# Patient Record
Sex: Female | Born: 1995 | Race: Black or African American | Hispanic: No | Marital: Single | Smoking: Former smoker
Health system: Southern US, Community
[De-identification: ages and names within clinical notes are randomized; demographics above are authoritative.]

## PROBLEM LIST (undated history)

## (undated) ENCOUNTER — Inpatient Hospital Stay (HOSPITAL_COMMUNITY): Payer: Self-pay

## (undated) ENCOUNTER — Emergency Department (HOSPITAL_COMMUNITY)

## (undated) DIAGNOSIS — F319 Bipolar disorder, unspecified: Secondary | ICD-10-CM

## (undated) DIAGNOSIS — N390 Urinary tract infection, site not specified: Secondary | ICD-10-CM

## (undated) DIAGNOSIS — F1410 Cocaine abuse, uncomplicated: Secondary | ICD-10-CM

## (undated) DIAGNOSIS — F32A Depression, unspecified: Secondary | ICD-10-CM

## (undated) HISTORY — PX: WISDOM TOOTH EXTRACTION: SHX21

---
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
## 2016-03-26 ENCOUNTER — Ambulatory Visit
Admission: RE | Admit: 2016-03-26 | Discharge: 2016-03-26 | Disposition: A | Discharge status: Home / Self Care | Payer: Medicaid Other | Source: Ambulatory Visit | Attending: Family Medicine | Admitting: Family Medicine

## 2016-03-26 ENCOUNTER — Other Ambulatory Visit: Payer: Self-pay | Admitting: Student

## 2016-03-26 DIAGNOSIS — T1490XA Injury, unspecified, initial encounter: Secondary | ICD-10-CM

## 2016-03-26 DIAGNOSIS — R52 Pain, unspecified: Secondary | ICD-10-CM

## 2016-12-06 ENCOUNTER — Emergency Department
Admission: EM | Admit: 2016-12-06 | Discharge: 2016-12-07 | Disposition: A | Discharge status: Home / Self Care | Payer: Medicaid Other | Attending: Emergency Medicine | Admitting: Emergency Medicine

## 2016-12-06 DIAGNOSIS — Y999 Unspecified external cause status: Secondary | ICD-10-CM | POA: Diagnosis not present

## 2016-12-06 DIAGNOSIS — S50812A Abrasion of left forearm, initial encounter: Secondary | ICD-10-CM | POA: Insufficient documentation

## 2016-12-06 DIAGNOSIS — Y92410 Unspecified street and highway as the place of occurrence of the external cause: Secondary | ICD-10-CM | POA: Diagnosis not present

## 2016-12-06 DIAGNOSIS — Y9389 Activity, other specified: Secondary | ICD-10-CM | POA: Diagnosis not present

## 2016-12-06 DIAGNOSIS — S0083XA Contusion of other part of head, initial encounter: Secondary | ICD-10-CM | POA: Diagnosis not present

## 2016-12-06 DIAGNOSIS — Z79899 Other long term (current) drug therapy: Secondary | ICD-10-CM | POA: Insufficient documentation

## 2016-12-06 DIAGNOSIS — R51 Headache: Secondary | ICD-10-CM | POA: Diagnosis present

## 2016-12-06 DIAGNOSIS — T148XXA Other injury of unspecified body region, initial encounter: Secondary | ICD-10-CM

## 2016-12-06 HISTORY — DX: Bipolar disorder, unspecified: F31.9

## 2016-12-07 ENCOUNTER — Emergency Department: Payer: Medicaid Other

## 2016-12-07 ENCOUNTER — Encounter: Payer: Self-pay | Admitting: Emergency Medicine

## 2016-12-07 LAB — CBC WITH DIFFERENTIAL/PLATELET
BASOS ABS: 0 10*3/uL (ref 0–0.1)
BASOS PCT: 1 %
EOS ABS: 0.1 10*3/uL (ref 0–0.7)
EOS PCT: 3 %
HCT: 39.5 % (ref 35.0–47.0)
Hemoglobin: 13.6 g/dL (ref 12.0–16.0)
LYMPHS PCT: 47 %
Lymphs Abs: 2.2 10*3/uL (ref 1.0–3.6)
MCH: 31.4 pg (ref 26.0–34.0)
MCHC: 34.4 g/dL (ref 32.0–36.0)
MCV: 91.3 fL (ref 80.0–100.0)
Monocytes Absolute: 0.6 10*3/uL (ref 0.2–0.9)
Monocytes Relative: 12 %
Neutro Abs: 1.7 10*3/uL (ref 1.4–6.5)
Neutrophils Relative %: 37 %
Platelets: 306 10*3/uL (ref 150–440)
RBC: 4.33 MIL/uL (ref 3.80–5.20)
RDW: 12.6 % (ref 11.5–14.5)
WBC: 4.6 10*3/uL (ref 3.6–11.0)

## 2016-12-07 LAB — BASIC METABOLIC PANEL
ANION GAP: 13 (ref 5–15)
BUN: 6 mg/dL (ref 6–20)
CO2: 19 mmol/L — ABNORMAL LOW (ref 22–32)
Calcium: 9 mg/dL (ref 8.9–10.3)
Chloride: 110 mmol/L (ref 101–111)
Creatinine, Ser: 0.88 mg/dL (ref 0.44–1.00)
Glucose, Bld: 88 mg/dL (ref 65–99)
Potassium: 3.1 mmol/L — ABNORMAL LOW (ref 3.5–5.1)
SODIUM: 142 mmol/L (ref 135–145)

## 2016-12-07 LAB — POCT PREGNANCY, URINE: Preg Test, Ur: NEGATIVE

## 2016-12-07 MED ORDER — SODIUM CHLORIDE 0.9 % IV BOLUS (SEPSIS)
1000.0000 mL | Freq: Once | INTRAVENOUS | Status: AC
  Administered 2016-12-07: 1000 mL via INTRAVENOUS

## 2016-12-07 NOTE — ED Notes (Signed)
Pt back from CT
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
## 2016-12-07 NOTE — Discharge Instructions (Signed)
Please seek medical attention for any high fevers, chest pain, shortness of breath, change in behavior, persistent vomiting, bloody stool or any other new or concerning symptoms.  

## 2016-12-07 NOTE — ED Notes (Signed)
Pt transported to CT ?
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
## 2016-12-13 ENCOUNTER — Emergency Department
Admission: EM | Admit: 2016-12-13 | Discharge: 2016-12-14 | Disposition: A | Discharge status: Home / Self Care | Payer: Medicaid Other | Attending: Emergency Medicine | Admitting: Emergency Medicine

## 2016-12-13 ENCOUNTER — Encounter: Payer: Self-pay | Admitting: Emergency Medicine

## 2016-12-13 DIAGNOSIS — Z008 Encounter for other general examination: Secondary | ICD-10-CM | POA: Diagnosis present

## 2016-12-13 DIAGNOSIS — F10129 Alcohol abuse with intoxication, unspecified: Secondary | ICD-10-CM | POA: Insufficient documentation

## 2016-12-13 DIAGNOSIS — F10929 Alcohol use, unspecified with intoxication, unspecified: Secondary | ICD-10-CM

## 2016-12-13 DIAGNOSIS — F1210 Cannabis abuse, uncomplicated: Secondary | ICD-10-CM | POA: Diagnosis not present

## 2016-12-13 LAB — POCT PREGNANCY, URINE: Preg Test, Ur: NEGATIVE

## 2016-12-13 LAB — COMPREHENSIVE METABOLIC PANEL
ALK PHOS: 44 U/L (ref 38–126)
ALT: 14 U/L (ref 14–54)
ANION GAP: 10 (ref 5–15)
AST: 23 U/L (ref 15–41)
Albumin: 4.4 g/dL (ref 3.5–5.0)
BILIRUBIN TOTAL: 0.5 mg/dL (ref 0.3–1.2)
BUN: <5 mg/dL — ABNORMAL LOW (ref 6–20)
CALCIUM: 8.8 mg/dL — AB (ref 8.9–10.3)
CO2: 22 mmol/L (ref 22–32)
Chloride: 114 mmol/L — ABNORMAL HIGH (ref 101–111)
Creatinine, Ser: 0.7 mg/dL (ref 0.44–1.00)
GFR calc non Af Amer: >60 mL/min (ref >60)
Glucose, Bld: 85 mg/dL (ref 65–99)
Potassium: 3.1 mmol/L — ABNORMAL LOW (ref 3.5–5.1)
Sodium: 146 mmol/L — ABNORMAL HIGH (ref 135–145)
TOTAL PROTEIN: 7.5 g/dL (ref 6.5–8.1)

## 2016-12-13 LAB — CBC
HCT: 40.4 % (ref 35.0–47.0)
Hemoglobin: 14 g/dL (ref 12.0–16.0)
MCH: 31 pg (ref 26.0–34.0)
MCHC: 34.5 g/dL (ref 32.0–36.0)
MCV: 89.9 fL (ref 80.0–100.0)
PLATELETS: 293 10*3/uL (ref 150–440)
RBC: 4.5 MIL/uL (ref 3.80–5.20)
RDW: 12.7 % (ref 11.5–14.5)
WBC: 5.1 10*3/uL (ref 3.6–11.0)

## 2016-12-13 LAB — URINE DRUG SCREEN, QUALITATIVE (ARMC ONLY)
Amphetamines, Ur Screen: NOT DETECTED
BARBITURATES, UR SCREEN: NOT DETECTED
BENZODIAZEPINE, UR SCRN: NOT DETECTED
CANNABINOID 50 NG, UR ~~LOC~~: POSITIVE — AB
COCAINE METABOLITE, UR ~~LOC~~: NOT DETECTED
MDMA (Ecstasy)Ur Screen: NOT DETECTED
Methadone Scn, Ur: NOT DETECTED
OPIATE, UR SCREEN: NOT DETECTED
PHENCYCLIDINE (PCP) UR S: NOT DETECTED
TRICYCLIC, UR SCREEN: NOT DETECTED

## 2016-12-13 LAB — ACETAMINOPHEN LEVEL: Acetaminophen (Tylenol), Serum: <10 ug/mL — ABNORMAL LOW (ref 10–30)

## 2016-12-13 LAB — ETHANOL: Alcohol, Ethyl (B): 207 mg/dL — ABNORMAL HIGH (ref <5)
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
## 2016-12-13 NOTE — ED Notes (Signed)
Pt agitated and crying loudly, Dr York Cerise to pt, Lafonda Mosses, NT able to calm pt, pt upset because boyfriend not answering phone and got off work at "five o'clock", pt oriented to POC: pt is to calm and sober and is willing to relax until labs result

## 2016-12-13 NOTE — Discharge Instructions (Signed)
You were seen in the emergency department for alcohol intoxication and bizarre behavior.  We, and at least some of your family, are concerned about your recent dangerous behaviors, but at this point we do not feel you would benefit from being kept in the hospital against your will, and you currently are refusing additional care and treatment.  Please seek help from the recommended resources for assistance with your alcohol dependence.  If you have any thoughts of hurting herself or others, please call 911 or return to the emergency department.  Please avoid drug and alcohol use.  Never drive a vehicle or operate machinery while intoxicated.

## 2016-12-13 NOTE — ED Notes (Signed)
Pt ambulatory into hallway and around room with no difficulty noted, pt oriented and alert x 4, wants to engage this staff with topics on TV [facility]), this RN asked if pt wants to discuss today's events with SANE RN or psych team and TTS but pt refused, pt denies wanting help with substance and ETOH abuse, pt denies SI/HI and reports wanting to go home, pt reports pt can use phone to call responsible adult for transport, Dr York Cerise notified
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
## 2016-12-13 NOTE — ED Notes (Signed)
Pt given phone again to secure ride home

## 2016-12-13 NOTE — ED Notes (Signed)
Pt given meal and ginger ale, pt calmly watching tv

## 2016-12-13 NOTE — ED Notes (Addendum)
Pt crying in room, reports "no one can come get me", pt calmed and returned to bed, offered taxi pt reports "I want to do that"  Pt told EDP would be consulted, Charge notified

## 2016-12-13 NOTE — ED Notes (Signed)
Pt reports boyfriend called for pick up, first RN Dewayne Hatch called to apprise
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
## 2017-01-13 ENCOUNTER — Encounter (HOSPITAL_COMMUNITY): Payer: Self-pay | Admitting: Family Medicine

## 2017-01-13 ENCOUNTER — Emergency Department (HOSPITAL_COMMUNITY)
Admission: EM | Admit: 2017-01-13 | Discharge: 2017-01-14 | Disposition: A | Discharge status: Other Acute Inpatient Hospital | Payer: Self-pay | Attending: Emergency Medicine | Admitting: Emergency Medicine

## 2017-01-13 DIAGNOSIS — F17210 Nicotine dependence, cigarettes, uncomplicated: Secondary | ICD-10-CM | POA: Insufficient documentation

## 2017-01-13 DIAGNOSIS — F32A Depression, unspecified: Secondary | ICD-10-CM

## 2017-01-13 DIAGNOSIS — F329 Major depressive disorder, single episode, unspecified: Secondary | ICD-10-CM | POA: Insufficient documentation

## 2017-01-13 DIAGNOSIS — R45851 Suicidal ideations: Secondary | ICD-10-CM | POA: Insufficient documentation

## 2017-01-13 LAB — COMPREHENSIVE METABOLIC PANEL
ALT: 10 U/L — ABNORMAL LOW (ref 14–54)
ANION GAP: 8 (ref 5–15)
AST: 17 U/L (ref 15–41)
Albumin: 4.2 g/dL (ref 3.5–5.0)
Alkaline Phosphatase: 49 U/L (ref 38–126)
BILIRUBIN TOTAL: 0.2 mg/dL — AB (ref 0.3–1.2)
BUN: 8 mg/dL (ref 6–20)
CHLORIDE: 106 mmol/L (ref 101–111)
CO2: 26 mmol/L (ref 22–32)
Calcium: 9.1 mg/dL (ref 8.9–10.3)
Creatinine, Ser: 0.75 mg/dL (ref 0.44–1.00)
Glucose, Bld: 78 mg/dL (ref 65–99)
POTASSIUM: 3.5 mmol/L (ref 3.5–5.1)
Sodium: 140 mmol/L (ref 135–145)
TOTAL PROTEIN: 7.4 g/dL (ref 6.5–8.1)

## 2017-01-13 LAB — POC URINE PREG, ED: Preg Test, Ur: NEGATIVE

## 2017-01-13 LAB — RAPID URINE DRUG SCREEN, HOSP PERFORMED
AMPHETAMINES: NOT DETECTED
BENZODIAZEPINES: NOT DETECTED
Barbiturates: NOT DETECTED
Cocaine: NOT DETECTED
OPIATES: NOT DETECTED
Tetrahydrocannabinol: POSITIVE — AB

## 2017-01-13 LAB — ETHANOL

## 2017-01-13 LAB — ACETAMINOPHEN LEVEL

## 2017-01-13 LAB — CBC
HCT: 42.9 % (ref 36.0–46.0)
Hemoglobin: 14.3 g/dL (ref 12.0–15.0)
MCH: 30.4 pg (ref 26.0–34.0)
MCHC: 33.3 g/dL (ref 30.0–36.0)
MCV: 91.3 fL (ref 78.0–100.0)
PLATELETS: 297 10*3/uL (ref 150–400)
RBC: 4.7 MIL/uL (ref 3.87–5.11)
RDW: 12.1 % (ref 11.5–15.5)
WBC: 7.2 10*3/uL (ref 4.0–10.5)

## 2017-01-13 LAB — SALICYLATE LEVEL

## 2017-01-13 MED ORDER — IBUPROFEN 200 MG PO TABS: 600.0000 mg | ORAL_TABLET | Freq: Three times a day (TID) | ORAL | Status: DC | PRN

## 2017-01-13 MED ORDER — LORAZEPAM 1 MG PO TABS: 0.0000 mg | ORAL_TABLET | Freq: Four times a day (QID) | ORAL | Status: DC

## 2017-01-13 MED ORDER — LORAZEPAM 1 MG PO TABS: 0.0000 mg | ORAL_TABLET | Freq: Two times a day (BID) | ORAL | Status: DC

## 2017-01-13 MED ORDER — THIAMINE HCL 100 MG/ML IJ SOLN: 100.0000 mg | Freq: Every day | INTRAMUSCULAR | Status: DC

## 2017-01-13 MED ORDER — LORAZEPAM 2 MG/ML IJ SOLN
0.0000 mg | Freq: Two times a day (BID) | INTRAMUSCULAR | Status: DC
Start: 2017-01-15 — End: 2017-01-14

## 2017-01-13 MED ORDER — LORAZEPAM 2 MG/ML IJ SOLN: 0.0000 mg | Freq: Four times a day (QID) | INTRAMUSCULAR | Status: DC

## 2017-01-13 MED ORDER — VITAMIN B-1 100 MG PO TABS
100.0000 mg | ORAL_TABLET | Freq: Every day | ORAL | Status: DC
  Administered 2017-01-13: 100 mg via ORAL
  Filled 2017-01-13: qty 1
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
## 2017-01-13 NOTE — ED Notes (Signed)
Bed: [facility] Expected date:  Expected time:  Means of arrival:  Comments: 

## 2017-01-13 NOTE — Patient Outreach (Signed)
Patient is being transferred to [facility] inpatient. Deborah  Fischer and I plan to follow-up her after she is transferred over.

## 2017-01-13 NOTE — ED Notes (Signed)
SPEAKING WITH TTS

## 2017-01-13 NOTE — ED Triage Notes (Signed)
Patient is accompanied by grandmother and is voluntary wanting assistance. Grandmother reports patient called her reporting she didn't want to do this anymore and wanted to move back in with her grandmother. Also, stated she needed help with drugs, had not eat, and was homeless. Patient denies saying she didn't want to do this anymore. She reports she does want to move back in with her grandmother and is doing all this to get back in to her home.

## 2017-01-13 NOTE — ED Notes (Signed)
Patient ambulated to hallway C with staff. Patient remains calm and cooperative. One bag of belongings were transported to the 23-25 assignment cabinet. Patient has had her lunch tray.

## 2017-01-13 NOTE — ED Notes (Signed)
Bed: [facility] Expected date:  Expected time:  Means of arrival:  Comments: Triage 4, Waiting for blood to be drawn
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
## 2017-01-13 NOTE — BHH Counselor (Signed)
Inpatient treatment recommended per Nanine Means, DNP,

## 2017-01-13 NOTE — BHH Counselor (Signed)
Patient accepted inpatient [facility]

## 2017-01-14 ENCOUNTER — Encounter (HOSPITAL_COMMUNITY): Payer: Self-pay

## 2017-01-14 ENCOUNTER — Inpatient Hospital Stay (HOSPITAL_COMMUNITY)
Admission: AD | Admit: 2017-01-14 | Discharge: 2017-01-18 | DRG: 885 | Disposition: A | Discharge status: Home / Self Care | Payer: Federal, State, Local not specified - Other | Source: Intra-hospital | Attending: Psychiatry | Admitting: Psychiatry

## 2017-01-14 DIAGNOSIS — Z560 Unemployment, unspecified: Secondary | ICD-10-CM

## 2017-01-14 DIAGNOSIS — F1020 Alcohol dependence, uncomplicated: Secondary | ICD-10-CM | POA: Diagnosis present

## 2017-01-14 DIAGNOSIS — F17210 Nicotine dependence, cigarettes, uncomplicated: Secondary | ICD-10-CM | POA: Diagnosis present

## 2017-01-14 DIAGNOSIS — R454 Irritability and anger: Secondary | ICD-10-CM

## 2017-01-14 DIAGNOSIS — Z630 Problems in relationship with spouse or partner: Secondary | ICD-10-CM

## 2017-01-14 DIAGNOSIS — F419 Anxiety disorder, unspecified: Secondary | ICD-10-CM

## 2017-01-14 DIAGNOSIS — R45851 Suicidal ideations: Secondary | ICD-10-CM | POA: Diagnosis present

## 2017-01-14 DIAGNOSIS — G4700 Insomnia, unspecified: Secondary | ICD-10-CM | POA: Diagnosis present

## 2017-01-14 DIAGNOSIS — F332 Major depressive disorder, recurrent severe without psychotic features: Secondary | ICD-10-CM | POA: Diagnosis present

## 2017-01-14 DIAGNOSIS — F4310 Post-traumatic stress disorder, unspecified: Secondary | ICD-10-CM | POA: Diagnosis present

## 2017-01-14 DIAGNOSIS — Z818 Family history of other mental and behavioral disorders: Secondary | ICD-10-CM

## 2017-01-14 DIAGNOSIS — Z915 Personal history of self-harm: Secondary | ICD-10-CM | POA: Diagnosis not present

## 2017-01-14 DIAGNOSIS — Z91018 Allergy to other foods: Secondary | ICD-10-CM | POA: Diagnosis not present

## 2017-01-14 DIAGNOSIS — Z880 Allergy status to penicillin: Secondary | ICD-10-CM | POA: Diagnosis not present

## 2017-01-14 DIAGNOSIS — Z590 Homelessness: Secondary | ICD-10-CM

## 2017-01-14 DIAGNOSIS — F319 Bipolar disorder, unspecified: Principal | ICD-10-CM | POA: Diagnosis present

## 2017-01-14 DIAGNOSIS — F3163 Bipolar disorder, current episode mixed, severe, without psychotic features: Secondary | ICD-10-CM

## 2017-01-14 DIAGNOSIS — F1290 Cannabis use, unspecified, uncomplicated: Secondary | ICD-10-CM | POA: Diagnosis present

## 2017-01-14 DIAGNOSIS — Z6379 Other stressful life events affecting family and household: Secondary | ICD-10-CM | POA: Diagnosis not present

## 2017-01-14 DIAGNOSIS — R450 Nervousness: Secondary | ICD-10-CM

## 2017-01-14 MED ORDER — LOPERAMIDE HCL 2 MG PO CAPS: 2.0000 mg | ORAL_CAPSULE | ORAL | Status: AC | PRN

## 2017-01-14 MED ORDER — ALUM & MAG HYDROXIDE-SIMETH 200-200-20 MG/5ML PO SUSP: 30.0000 mL | ORAL | Status: DC | PRN

## 2017-01-14 MED ORDER — ADULT MULTIVITAMIN W/MINERALS CH
1.0000 | ORAL_TABLET | Freq: Every day | ORAL | Status: DC
  Administered 2017-01-14 – 2017-01-18 (×5): 1 via ORAL
  Filled 2017-01-14 (×8): qty 1

## 2017-01-14 MED ORDER — ESCITALOPRAM OXALATE 5 MG PO TABS
5.0000 mg | ORAL_TABLET | Freq: Every day | ORAL | Status: DC
  Administered 2017-01-14: 5 mg via ORAL
  Filled 2017-01-14 (×4): qty 1

## 2017-01-14 MED ORDER — LORAZEPAM 1 MG PO TABS: 1.0000 mg | ORAL_TABLET | Freq: Every day | ORAL | Status: DC

## 2017-01-14 MED ORDER — THIAMINE HCL 100 MG/ML IJ SOLN: 100.0000 mg | Freq: Once | INTRAMUSCULAR | Status: DC

## 2017-01-14 MED ORDER — HYDROXYZINE HCL 25 MG PO TABS
25.0000 mg | ORAL_TABLET | Freq: Four times a day (QID) | ORAL | Status: DC | PRN
  Administered 2017-01-14 – 2017-01-18 (×6): 25 mg via ORAL
  Filled 2017-01-14 (×6): qty 1

## 2017-01-14 MED ORDER — TRAZODONE HCL 50 MG PO TABS
50.0000 mg | ORAL_TABLET | Freq: Every evening | ORAL | Status: DC | PRN
  Administered 2017-01-14 – 2017-01-17 (×5): 50 mg via ORAL
  Filled 2017-01-14 (×2): qty 1
  Filled 2017-01-14: qty 14
  Filled 2017-01-14 (×7): qty 1
  Filled 2017-01-14 (×2): qty 14
  Filled 2017-01-14: qty 1
  Filled 2017-01-14: qty 14
  Filled 2017-01-14: qty 1
  Filled 2017-01-14: qty 14
  Filled 2017-01-14: qty 1

## 2017-01-14 MED ORDER — LORAZEPAM 1 MG PO TABS
1.0000 mg | ORAL_TABLET | Freq: Four times a day (QID) | ORAL | Status: AC | PRN
  Administered 2017-01-14: 1 mg via ORAL
  Filled 2017-01-14: qty 1

## 2017-01-14 MED ORDER — ONDANSETRON 4 MG PO TBDP
4.0000 mg | ORAL_TABLET | Freq: Four times a day (QID) | ORAL | Status: AC | PRN
Start: 2017-01-14 — End: 2017-01-17
  Filled 2017-01-14: qty 1

## 2017-01-14 MED ORDER — NICOTINE 21 MG/24HR TD PT24
21.0000 mg | MEDICATED_PATCH | Freq: Every day | TRANSDERMAL | Status: DC
  Administered 2017-01-14 – 2017-01-18 (×5): 21 mg via TRANSDERMAL
  Filled 2017-01-14 (×8): qty 1

## 2017-01-14 MED ORDER — LORAZEPAM 1 MG PO TABS
1.0000 mg | ORAL_TABLET | Freq: Three times a day (TID) | ORAL | Status: AC
  Administered 2017-01-15 – 2017-01-16 (×3): 1 mg via ORAL
  Filled 2017-01-14 (×3): qty 1

## 2017-01-14 MED ORDER — LORAZEPAM 1 MG PO TABS: 1.0000 mg | ORAL_TABLET | Freq: Two times a day (BID) | ORAL | Status: DC

## 2017-01-14 MED ORDER — LORAZEPAM 1 MG PO TABS
1.0000 mg | ORAL_TABLET | Freq: Four times a day (QID) | ORAL | Status: AC
  Administered 2017-01-14 – 2017-01-15 (×4): 1 mg via ORAL
  Filled 2017-01-14 (×4): qty 1

## 2017-01-14 MED ORDER — NICOTINE POLACRILEX 2 MG MT GUM: 2.0000 mg | CHEWING_GUM | OROMUCOSAL | Status: DC | PRN

## 2017-01-14 MED ORDER — VITAMIN B-1 100 MG PO TABS
100.0000 mg | ORAL_TABLET | Freq: Every day | ORAL | Status: DC
  Administered 2017-01-15 – 2017-01-18 (×4): 100 mg via ORAL
  Filled 2017-01-14 (×7): qty 1

## 2017-01-14 MED ORDER — ACETAMINOPHEN 325 MG PO TABS: 650.0000 mg | ORAL_TABLET | Freq: Four times a day (QID) | ORAL | Status: DC | PRN

## 2017-01-14 MED ORDER — ARIPIPRAZOLE 5 MG PO TABS
5.0000 mg | ORAL_TABLET | Freq: Every day | ORAL | Status: DC
  Filled 2017-01-14 (×2): qty 1

## 2017-01-14 MED ORDER — NICOTINE POLACRILEX 2 MG MT GUM
CHEWING_GUM | OROMUCOSAL | Status: AC
  Administered 2017-01-14: 22:00:00
  Filled 2017-01-14: qty 1

## 2017-01-14 MED ORDER — QUETIAPINE FUMARATE 200 MG PO TABS
200.0000 mg | ORAL_TABLET | Freq: Every day | ORAL | Status: DC
  Administered 2017-01-14 – 2017-01-15 (×2): 200 mg via ORAL
  Filled 2017-01-14 (×5): qty 1

## 2017-01-14 MED ORDER — MAGNESIUM HYDROXIDE 400 MG/5ML PO SUSP: 30.0000 mL | Freq: Every day | ORAL | Status: DC | PRN
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
## 2017-01-14 NOTE — Progress Notes (Signed)
Pt seen by EDP.  Pt cleared for xfer to [facility].  Pt denies pain and discomfort.  Pt vitals WNL.  Pt signed consent to xfer.  Pt denies SI, HI and AVH.  Pt contracts for safety verbally.

## 2017-01-14 NOTE — BHH Group Notes (Signed)
The focus of this group is to educate the patient on the purpose and policies of crisis stabilization and provide a format to answer questions about their admission.  The group details unit policies and expectations of patients while admitted.  Patient did not attend 0900 nurse education orientation group this morning.  Patient stayed in room.  

## 2017-01-14 NOTE — BHH Group Notes (Signed)
LCSW Group Therapy Note  01/14/2017 1:15pm  Type of Therapy/Topic:  Group Therapy:  Feelings about Diagnosis  Participation Level: Pt invited. Did not attend.   Jonathon Jordan, MSW, LCSWA 01/14/2017 3:46 PM

## 2017-01-14 NOTE — Progress Notes (Signed)
Recreation Therapy Notes   Animal-Assisted Activity (AAA) Program Checklist/Progress Notes Patient Eligibility Criteria Checklist & Daily Group note for Rec TxIntervention  Date: 09.26.2018 Time: 2:45pm Location: [address] Dayroom   AAA/T Program Assumption of Risk Form signed by Patient/ or Parent Legal Guardian Yes  Patient is free of allergies or sever asthma Yes  Patient reports no fear of animals Yes  Patient reports no history of cruelty to animals Yes  Patient understands his/her participation is voluntary Yes  Patient washes hands before animal contact Yes  Patient washes hands after animal contact Yes  Behavioral Response: Appropriate   Education:Hand Washing, Appropriate Animal Interaction   Education Outcome: Acknowledges education.   Clinical Observations/Feedback: Patient attended session and interacted appropriately with therapy dog and peers.   Deborah Fischer, LRT/CTRS         Deborah Fischer 01/14/2017 3:07 PM 

## 2017-01-14 NOTE — Discharge Summary (Signed)
Re-examination prior to transfer to [facility] Deborah Fischer  Patient states she feels unchanged in mood. She has a flat affect but is responsive and cooperative. VSS. She is felt stable for transfer.
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
## 2017-01-14 NOTE — Tx Team (Signed)
Initial Treatment Plan 01/14/2017 2:48 AM Annaelle Mccormac ZOX:[id number]    PATIENT STRESSORS: Marital or family conflict Medication change or noncompliance Substance abuse   PATIENT STRENGTHS: Capable of independent living General fund of knowledge Motivation for treatment/growth   PATIENT IDENTIFIED PROBLEMS: "get help for my withdrawal symptoms"   "get medication for depression"                   DISCHARGE CRITERIA:  Improved stabilization in mood, thinking, and/or behavior Verbal commitment to aftercare and medication compliance Withdrawal symptoms are absent or subacute and managed without 24-hour nursing intervention  PRELIMINARY DISCHARGE PLAN: Attend aftercare/continuing care group Attend 12-step recovery group Placement in alternative living arrangements  PATIENT/FAMILY INVOLVEMENT: This treatment plan has been presented to and reviewed with the patient, Riesa Pope, and/or family member.  The patient and family have been given the opportunity to ask questions and make suggestions.  Andrena Mews, RN 01/14/2017, 2:48 AM

## 2017-01-14 NOTE — Progress Notes (Signed)
Pt is a 21 year old female admitted with ETOH abuse and depression   She said she took a lot of pills last week in a suicide attempt but they just made her sick and sleepy    Pt is homeless and is not sure if she will have a place to live when she is discharged but she said her grandmother may let her come there    Pt reports drinking daily and drinking 5 to 6 drinks   She reports minimal to moderate withdrawal symptoms presently    She was cooperative during her assessment    Pt was oriented to the unit  Offered nourishment   Medications administered and effectiveness monitored    Q 15 min checks    Pt is safe at present time
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
## 2017-01-14 NOTE — Discharge Instructions (Signed)
Transfer to [facility] °

## 2017-01-15 NOTE — [facility] Group Notes (Addendum)
[facility] Group Therapy 01/15/2017 1:15pm  Type of Therapy: [facility] Presentation  Participation Level: Pt invited. Did not attend. Pt attended a group on the other hall instead.  Jonathon Jordan, MSW, LCSWA 01/15/2017 4:45 PM

## 2017-01-15 NOTE — Progress Notes (Signed)
D   Pt has been pleasant and cooperative this evening   She talked about her visit from her grandmother and her triggers for anxiety   She interacts appropriately with other patients and attended AA group    She said she felt very supported in the group and learned a lot A   Verbal support given   Medications administered and effectiveness monitored   Q 15 min checks R    Pt is safe at present time and receptive to verbal support

## 2017-01-15 NOTE — [facility] Group Notes (Signed)
LCSW Group Therapy Note  01/15/2017 1:15pm  Type of Therapy and Topic:  Group Therapy:  Feelings around Relapse and Recovery  Participation Level:  Minimal   Description of Group:    Patients in this group will discuss emotions they experience before and after a relapse. They will process how experiencing these feelings, or avoidance of experiencing them, relates to having a relapse. Facilitator will guide patients to explore emotions they have related to recovery. Patients will be encouraged to process which emotions are more powerful. They will be guided to discuss the emotional reaction significant others in their lives may have to their relapse or recovery. Patients will be assisted in exploring ways to respond to the emotions of others without this contributing to a relapse.  Therapeutic Goals: 1. Patient will identify two or more emotions that lead to a relapse for them 2. Patient will identify two emotions that result when they relapse 3. Patient will identify two emotions related to recovery 4. Patient will demonstrate ability to communicate their needs through discussion and/or role plays   Summary of Patient Progress:  Pt attended group late and did not participate in group discussion. She sat outside of group in corner. Depressed mood and flat affect. At this time, she does not appear to be making progress in the group setting.    Therapeutic Modalities:   Cognitive Behavioral Therapy Solution-Focused Therapy Assertiveness Training Relapse Prevention Therapy   Ledell Peoples Smart, LCSW 01/15/2017 4:01 PM

## 2017-01-15 NOTE — Tx Team (Signed)
Interdisciplinary Treatment and Diagnostic Plan Update 01/15/2017 Time of Session: 9:30am  Deborah Fischer  MRN: [id number]  Principal Diagnosis: Bipolar disorder (HCC)  Secondary Diagnoses: Principal Problem:   Bipolar disorder (HCC) Active Problems:   Alcohol use disorder, moderate, dependence (HCC)   Current Medications:  Current Facility-Administered Medications  Medication Dose Route Frequency Provider Last Rate Last Dose  . acetaminophen (TYLENOL) tablet 650 mg  650 mg Oral Q6H PRN Kerry Hough, PA-C      . alum & mag hydroxide-simeth (MAALOX/MYLANTA) 200-200-20 MG/5ML suspension 30 mL  30 mL Oral Q4H PRN Donell Sievert E, PA-C      . hydrOXYzine (ATARAX/VISTARIL) tablet 25 mg  25 mg Oral Q6H PRN Donell Sievert E, PA-C   25 mg at 01/14/17 2205  . loperamide (IMODIUM) capsule 2-4 mg  2-4 mg Oral PRN Kerry Hough, PA-C      . LORazepam (ATIVAN) tablet 1 mg  1 mg Oral Q6H PRN Kerry Hough, PA-C   1 mg at 01/14/17 1478  . LORazepam (ATIVAN) tablet 1 mg  1 mg Oral QID Donell Sievert E, PA-C   1 mg at 01/15/17 2956   Followed by  . LORazepam (ATIVAN) tablet 1 mg  1 mg Oral TID Kerry Hough, PA-C       Followed by  . [START ON 01/16/2017] LORazepam (ATIVAN) tablet 1 mg  1 mg Oral BID Donell Sievert E, PA-C       Followed by  . [START ON 01/18/2017] LORazepam (ATIVAN) tablet 1 mg  1 mg Oral Daily Simon, Spencer E, PA-C      . magnesium hydroxide (MILK OF MAGNESIA) suspension 30 mL  30 mL Oral Daily PRN Kerry Hough, PA-C      . multivitamin with minerals tablet 1 tablet  1 tablet Oral Daily Kerry Hough, PA-C   1 tablet at 01/15/17 [phone]  . nicotine (NICODERM CQ - dosed in mg/24 hours) patch 21 mg  21 mg Transdermal Daily Cobos, Rockey Situ, MD   21 mg at 01/15/17 0845  . ondansetron (ZOFRAN-ODT) disintegrating tablet 4 mg  4 mg Oral Q6H PRN Kerry Hough, PA-C      . QUEtiapine (SEROQUEL) tablet 200 mg  200 mg Oral QHS Izediuno, Delight Ovens, MD   200 mg at  01/14/17 2206  . thiamine (B-1) injection 100 mg  100 mg Intramuscular Once Donell Sievert E, PA-C      . thiamine (VITAMIN B-1) tablet 100 mg  100 mg Oral Daily Donell Sievert E, PA-C   100 mg at 01/15/17 0843  . traZODone (DESYREL) tablet 50 mg  50 mg Oral QHS,MR X 1 Kerry Hough, PA-C   50 mg at 01/14/17 2206    PTA Medications: Prescriptions Prior to Admission  Medication Sig Dispense Refill Last Dose  . ibuprofen (ADVIL,MOTRIN) 800 MG tablet Take 1 tablet (800 mg total) by mouth every 8 (eight) hours as needed. (Patient not taking: Reported on 12/13/2016) 30 tablet 0 Completed Course at Unknown time    Treatment Modalities: Medication Management, Group therapy, Case management,  1 to 1 session with clinician, Psychoeducation, Recreational therapy.  Patient Stressors: Marital or family conflict Medication change or noncompliance Substance abuse Patient Strengths: Capable of independent living General fund of knowledge Motivation for treatment/growth  Physician Treatment Plan for Primary Diagnosis: Bipolar disorder (HCC) Long Term Goal(s): Improvement in symptoms so as ready for discharge Short Term Goals: Ability to identify changes in lifestyle to reduce recurrence  of condition will improve Ability to verbalize feelings will improve Ability to disclose and discuss suicidal ideas Ability to demonstrate self-control will improve Ability to identify and develop effective coping behaviors will improve Ability to maintain clinical measurements within normal limits will improve Compliance with prescribed medications will improve Ability to identify triggers associated with substance abuse/mental health issues will improve Ability to identify changes in lifestyle to reduce recurrence of condition will improve Ability to verbalize feelings will improve Ability to disclose and discuss suicidal ideas Ability to demonstrate self-control will improve Ability to identify and develop  effective coping behaviors will improve Ability to maintain clinical measurements within normal limits will improve Compliance with prescribed medications will improve Ability to identify triggers associated with substance abuse/mental health issues will improve  Medication Management: Evaluate patient's response, side effects, and tolerance of medication regimen.  Therapeutic Interventions: 1 to 1 sessions, Unit Group sessions and Medication administration.  Evaluation of Outcomes: Progressing  Physician Treatment Plan for Secondary Diagnosis: Principal Problem:   Bipolar disorder (HCC) Active Problems:   Alcohol use disorder, moderate, dependence (HCC)  Long Term Goal(s): Improvement in symptoms so as ready for discharge  Short Term Goals: Ability to identify changes in lifestyle to reduce recurrence of condition will improve Ability to verbalize feelings will improve Ability to disclose and discuss suicidal ideas Ability to demonstrate self-control will improve Ability to identify and develop effective coping behaviors will improve Ability to maintain clinical measurements within normal limits will improve Compliance with prescribed medications will improve Ability to identify triggers associated with substance abuse/mental health issues will improve Ability to identify changes in lifestyle to reduce recurrence of condition will improve Ability to verbalize feelings will improve Ability to disclose and discuss suicidal ideas Ability to demonstrate self-control will improve Ability to identify and develop effective coping behaviors will improve Ability to maintain clinical measurements within normal limits will improve Compliance with prescribed medications will improve Ability to identify triggers associated with substance abuse/mental health issues will improve  Medication Management: Evaluate patient's response, side effects, and tolerance of medication regimen.  Therapeutic  Interventions: 1 to 1 sessions, Unit Group sessions and Medication administration.  Evaluation of Outcomes: Progressing  RN Treatment Plan for Primary Diagnosis: Bipolar disorder (HCC) Long Term Goal(s): Knowledge of disease and therapeutic regimen to maintain health will improve  Short Term Goals: Ability to verbalize feelings will improve, Ability to identify and develop effective coping behaviors will improve and Compliance with prescribed medications will improve  Medication Management: RN will administer medications as ordered by provider, will assess and evaluate patient's response and provide education to patient for prescribed medication. RN will report any adverse and/or side effects to prescribing provider.  Therapeutic Interventions: 1 on 1 counseling sessions, Psychoeducation, Medication administration, Evaluate responses to treatment, Monitor vital signs and CBGs as ordered, Perform/monitor CIWA, COWS, AIMS and Fall Risk screenings as ordered, Perform wound care treatments as ordered.  Evaluation of Outcomes: Progressing  LCSW Treatment Plan for Primary Diagnosis: Bipolar disorder (HCC) Long Term Goal(s): Safe transition to appropriate next level of care at discharge, Engage patient in therapeutic group addressing interpersonal concerns. Short Term Goals: Engage patient in aftercare planning with referrals and resources, Increase ability to appropriately verbalize feelings, Facilitate patient progression through stages of change regarding substance use diagnoses and concerns, Identify triggers associated with mental health/substance abuse issues and Increase skills for wellness and recovery  Therapeutic Interventions: Assess for all discharge needs, 1 to 1 time with Social worker, Explore available resources  and support systems, Assess for adequacy in community support network, Educate family and significant other(s) on suicide prevention, Complete Psychosocial Assessment,  Interpersonal group therapy.  Evaluation of Outcomes: Progressing  Progress in Treatment: Attending groups: No  Participating in groups: No Taking medication as prescribed: Yes, MD continues to assess for medication changes as needed Toleration medication: Yes, no side effects reported at this time Family/Significant other contact made: No, CSW attempting contact with pt's sister. Patient understands diagnosis: Limited insight Discussing patient identified problems/goals with staff: Yes Medical problems stabilized or resolved: Yes Denies suicidal/homicidal ideation: Yes  Issues/concerns per patient self-inventory: None Other: N/A  New problem(s) identified: None identified at this time.   New Short Term/Long Term Goal(s): "To detox and go to support groups"  Discharge Plan or Barriers: Pt will discharge to her grandmother's house and follow up with an outpatient provider.  Reason for Continuation of Hospitalization:  Depression Medication stabilization Suicidal ideation Withdrawal symptoms  Estimated Length of Stay: 1-3 days; Estimated discharge date 01/18/17  Attendees: Patient: Deborah Fischer  01/15/2017 9:13 AM  Physician: Dr. Jackquline Berlin 01/15/2017 9:13 AM  Nursing: Erskine Squibb, RN 01/15/2017 9:13 AM  RN Care Manager:  01/15/2017 9:13 AM  Social Worker: Donnelly Stager, LCSWA 01/15/2017 9:13 AM  Recreational Therapist:  01/15/2017 9:13 AM  Other: 01/15/2017 9:13 AM  Other:  01/15/2017 9:13 AM  Other: 01/15/2017 9:13 AM  Scribe for Treatment Team: Jonathon Jordan, MSW,LCSWA 01/15/2017 9:13 AM

## 2017-01-15 NOTE — Progress Notes (Signed)
[facility] MD Progress Note  01/15/2017 11:23 AM Deborah Fischer  MRN:  [id number] Subjective:   21 y.o AAF, single, lives with her grandmother, unemployed. Background history of Bipolar Disorder, repeated trauma and SUD. Presented in company of her grand mother. Reported suicidal thoughts at presentation.  UDS was positive for THC.  Chart reviewed today. Patient discussed at team today.  Staff reports that was relieved after her grandmother left yesterday. Felt the visitation went well. She attended some groups. She is focused on addiction treatment. She has not voiced any suicidal thoughts. She has not been observed to be internally stimulated. She slept well last night. No falls. Has not complained of dizziness.   Seen today. Says she feels calmer today. Her thoughts are clearer and not pressured. Slept well last night. No nightmares.Tolerating her medication well. Says her goal is to detox and get into residential treatment. No suicidal thoughts today.  Says her grandmother makes her nervous. When asked specifically what about her grandmother that sets off the panic buttons, she replied "everything". Says she would not feel comfortable living with her again. No abnormal perception. No thoughts of violence. No homicidal thoughts.    Principal Problem: Bipolar disorder ([place]) Diagnosis:   Patient Active Problem List   Diagnosis Date Noted  . Bipolar disorder ([place]) [F31.9] 01/14/2017  . Alcohol use disorder, moderate, dependence ([place]) [F10.20] 01/14/2017   Total Time spent with patient: 20 minutes  Past Psychiatric History: As in H&P  Past Medical History:  Past Medical History:  Diagnosis Date  . Manic depression ([facility])     Past Surgical History:  Procedure Laterality Date  . WISDOM TOOTH EXTRACTION     Family History: History reviewed. No pertinent family history. Family Psychiatric  History: As in H&P Social History:  History  Alcohol Use  . Yes    Comment: Daily. Beer: 3-4  bottles of beer. Liquor 1/2 5th (2-3 times a week) Last drink: 2 hours ago,.      History  Drug Use  . Types: Marijuana, Cocaine    Comment: Last marijuana: Last night, Last Crack: One week ago     Social History   Social History  . Marital status: Single    Spouse name: N/A  . Number of children: N/A  . Years of education: N/A   Social History Main Topics  . Smoking status: Current Every Day Smoker    Packs/day: 0.10    Types: Cigarettes  . Smokeless tobacco: Never Used  . Alcohol use Yes     Comment: Daily. Beer: 3-4 bottles of beer. Liquor 1/2 5th (2-3 times a week) Last drink: 2 hours ago,.   . Drug use: Yes    Types: Marijuana, Cocaine     Comment: Last marijuana: Last night, Last Crack: One week ago   . Sexual activity: No   Other Topics Concern  . None   Social History Narrative  . None   Additional Social History:    Pain Medications: see MAR Prescriptions: see MAR Over the Counter: see MAR History of alcohol / drug use?: Yes Negative Consequences of Use: Personal relationships, Financial Withdrawal Symptoms: Agitation, Sweats, Nausea / Vomiting Name of Substance 1: alcohoh 1 - Age of First Use: 15 1 - Amount (size/oz): varies 1 - Frequency: daily 1 - Duration: 5 years 1 - Last Use / Amount: 01/13/2017 Name of Substance 2: marijuana 2 - Age of First Use: 15 2 - Amount (size/oz): varies 2 - Frequency: daily 2 - Duration:  5 years 2 - Last Use / Amount: 01/13/2017                Sleep: Good. Reported to have slept for over six hours though charted as three hours here.   Appetite:  Good  Current Medications: Current Facility-Administered Medications  Medication Dose Route Frequency Provider Last Rate Last Dose  . acetaminophen (TYLENOL) tablet 650 mg  650 mg Oral Q6H PRN Laverle Hobby, PA-C      . alum & mag hydroxide-simeth (MAALOX/MYLANTA) 200-200-20 MG/5ML suspension 30 mL  30 mL Oral Q4H PRN Patriciaann Clan E, PA-C      . hydrOXYzine  (ATARAX/VISTARIL) tablet 25 mg  25 mg Oral Q6H PRN Patriciaann Clan E, PA-C   25 mg at 01/14/17 2205  . loperamide (IMODIUM) capsule 2-4 mg  2-4 mg Oral PRN Laverle Hobby, PA-C      . LORazepam (ATIVAN) tablet 1 mg  1 mg Oral Q6H PRN Laverle Hobby, PA-C   1 mg at 01/14/17 1700  . LORazepam (ATIVAN) tablet 1 mg  1 mg Oral QID Patriciaann Clan E, PA-C   1 mg at 01/15/17 1749   Followed by  . LORazepam (ATIVAN) tablet 1 mg  1 mg Oral TID Laverle Hobby, PA-C       Followed by  . [START ON 01/16/2017] LORazepam (ATIVAN) tablet 1 mg  1 mg Oral BID Patriciaann Clan E, PA-C       Followed by  . [START ON 01/18/2017] LORazepam (ATIVAN) tablet 1 mg  1 mg Oral Daily Simon, Spencer E, PA-C      . magnesium hydroxide (MILK OF MAGNESIA) suspension 30 mL  30 mL Oral Daily PRN Laverle Hobby, PA-C      . multivitamin with minerals tablet 1 tablet  1 tablet Oral Daily Laverle Hobby, PA-C   1 tablet at 01/15/17 [phone]  . nicotine (NICODERM CQ - dosed in mg/24 hours) patch 21 mg  21 mg Transdermal Daily Cobos, Myer Peer, MD   21 mg at 01/15/17 0845  . ondansetron (ZOFRAN-ODT) disintegrating tablet 4 mg  4 mg Oral Q6H PRN Laverle Hobby, PA-C      . QUEtiapine (SEROQUEL) tablet 200 mg  200 mg Oral QHS Izediuno, Laruth Bouchard, MD   200 mg at 01/14/17 2206  . thiamine (B-1) injection 100 mg  100 mg Intramuscular Once Patriciaann Clan E, PA-C      . thiamine (VITAMIN B-1) tablet 100 mg  100 mg Oral Daily Patriciaann Clan E, PA-C   100 mg at 01/15/17 0843  . traZODone (DESYREL) tablet 50 mg  50 mg Oral QHS,MR X 1 Laverle Hobby, PA-C   50 mg at 01/14/17 2206    Lab Results:  Results for orders placed or performed during the hospital encounter of 01/13/17 (from the past 48 hour(s))  Rapid urine drug screen (hospital performed)     Status: Abnormal   Collection Time: 01/13/17 12:19 PM  Result Value Ref Range   Opiates NONE DETECTED NONE DETECTED   Cocaine NONE DETECTED NONE DETECTED   Benzodiazepines NONE DETECTED  NONE DETECTED   Amphetamines NONE DETECTED NONE DETECTED   Tetrahydrocannabinol POSITIVE (A) NONE DETECTED   Barbiturates NONE DETECTED NONE DETECTED    Comment:        DRUG SCREEN FOR MEDICAL PURPOSES ONLY.  IF CONFIRMATION IS NEEDED FOR ANY PURPOSE, NOTIFY LAB WITHIN 5 DAYS.        LOWEST DETECTABLE LIMITS  FOR URINE DRUG SCREEN Drug Class       Cutoff (ng/mL) Amphetamine      1000 Barbiturate      200 Benzodiazepine   474 Tricyclics       259 Opiates          300 Cocaine          300 THC              50   POC urine preg, ED     Status: None   Collection Time: 01/13/17 12:31 PM  Result Value Ref Range   Preg Test, Ur NEGATIVE NEGATIVE    Comment:        THE SENSITIVITY OF THIS METHODOLOGY IS >24 mIU/mL   Comprehensive metabolic panel     Status: Abnormal   Collection Time: 01/13/17 12:34 PM  Result Value Ref Range   Sodium 140 135 - 145 mmol/L   Potassium 3.5 3.5 - 5.1 mmol/L   Chloride 106 101 - 111 mmol/L   CO2 26 22 - 32 mmol/L   Glucose, Bld 78 65 - 99 mg/dL   BUN 8 6 - 20 mg/dL   Creatinine, Ser 0.75 0.44 - 1.00 mg/dL   Calcium 9.1 8.9 - 10.3 mg/dL   Total Protein 7.4 6.5 - 8.1 g/dL   Albumin 4.2 3.5 - 5.0 g/dL   AST 17 15 - 41 U/L   ALT 10 (L) 14 - 54 U/L   Alkaline Phosphatase 49 38 - 126 U/L   Total Bilirubin 0.2 (L) 0.3 - 1.2 mg/dL   GFR calc non Af Amer >60 >60 mL/min   GFR calc Af Amer >60 >60 mL/min    Comment: (NOTE) The eGFR has been calculated using the CKD EPI equation. This calculation has not been validated in all clinical situations. eGFR's persistently <60 mL/min signify possible Chronic Kidney Disease.    Anion gap 8 5 - 15  cbc     Status: None   Collection Time: 01/13/17 12:34 PM  Result Value Ref Range   WBC 7.2 4.0 - 10.5 K/uL   RBC 4.70 3.87 - 5.11 MIL/uL   Hemoglobin 14.3 12.0 - 15.0 g/dL   HCT 42.9 36.0 - 46.0 %   MCV 91.3 78.0 - 100.0 fL   MCH 30.4 26.0 - 34.0 pg   MCHC 33.3 30.0 - 36.0 g/dL   RDW 12.1 11.5 - 15.5 %    Platelets 297 150 - 400 K/uL  Ethanol     Status: None   Collection Time: 01/13/17 12:35 PM  Result Value Ref Range   Alcohol, Ethyl (B) <5 <5 mg/dL    Comment:        LOWEST DETECTABLE LIMIT FOR SERUM ALCOHOL IS 5 mg/dL FOR MEDICAL PURPOSES ONLY   Salicylate level     Status: None   Collection Time: 01/13/17 12:35 PM  Result Value Ref Range   Salicylate Lvl <5.6 2.8 - 30.0 mg/dL  Acetaminophen level     Status: Abnormal   Collection Time: 01/13/17 12:35 PM  Result Value Ref Range   Acetaminophen (Tylenol), Serum <10 (L) 10 - 30 ug/mL    Comment:        THERAPEUTIC CONCENTRATIONS VARY SIGNIFICANTLY. A RANGE OF 10-30 ug/mL MAY BE AN EFFECTIVE CONCENTRATION FOR MANY PATIENTS. HOWEVER, SOME ARE BEST TREATED AT CONCENTRATIONS OUTSIDE THIS RANGE. ACETAMINOPHEN CONCENTRATIONS >150 ug/mL AT 4 HOURS AFTER INGESTION AND >50 ug/mL AT 12 HOURS AFTER INGESTION ARE OFTEN ASSOCIATED WITH TOXIC REACTIONS.  Blood Alcohol level:  Lab Results  Component Value Date   ETH <5 01/13/2017   ETH 207 (H) 13/11/6576    Metabolic Disorder Labs: No results found for: HGBA1C, MPG No results found for: PROLACTIN No results found for: CHOL, TRIG, HDL, CHOLHDL, VLDL, LDLCALC  Physical Findings: AIMS: Facial and Oral Movements Muscles of Facial Expression: None, normal Lips and Perioral Area: None, normal Jaw: None, normal Tongue: None, normal,Extremity Movements Upper (arms, wrists, hands, fingers): None, normal Lower (legs, knees, ankles, toes): None, normal, Trunk Movements Neck, shoulders, hips: None, normal, Overall Severity Severity of abnormal movements (highest score from questions above): None, normal Incapacitation due to abnormal movements: None, normal Patient's awareness of abnormal movements (rate only patient's report): No Awareness, Dental Status Current problems with teeth and/or dentures?: No Does patient usually wear dentures?: No  CIWA:  CIWA-Ar Total: 2 COWS:      Musculoskeletal: Strength & Muscle Tone: within normal limits Gait & Station: normal Patient leans: N/A  Psychiatric Specialty Exam: Physical Exam  ROS  Blood pressure 108/68, pulse (!) 107, temperature 98.3 F (36.8 C), temperature source Oral, resp. rate 16, height 5' 6"  (1.676 m), weight 60.8 kg (134 lb), last menstrual period 12/30/2016, SpO2 100 %.Body mass index is 21.63 kg/m.  General Appearance: Much calmer today. Good relatedness. Appropriate behavior. Not internally distracted.   Eye Contact:  Good  Speech:  Clear and Coherent and Normal Rate  Volume:  Normal  Mood:  Feels better. Objectively better  Affect:  Appropriate and Restricted  Thought Process:  Linear  Orientation:  Full (Time, Place, and Person)  Thought Content:  No delusional theme. No preoccupation with violent thoughts. No obsession.  No hallucination in any modality.   Suicidal Thoughts:  None today  Homicidal Thoughts:  No  Memory:  Immediate;   Good Recent;   Good Remote;   Good  Judgement:  Better  Insight:  Good  Psychomotor Activity:  Normal  Concentration:  Concentration: Good and Attention Span: Good  Recall:  Good  Fund of Knowledge:  Good  Language:  Good  Akathisia:  Negative  Handed:    AIMS (if indicated):     Assets:  Communication Skills Desire for Improvement Physical Health Resilience  ADL's:  Intact  Cognition:  WNL  Sleep:  Number of Hours: 3     Treatment Plan Summary:  Patient is calmer today. She is tolerating Seroquel at current dose well. She is motivated to deal with her addiction. We plan to evaluate her further. Hopefully could transition into residential care from here  Psychiatric: Bipolar Disorder SUD Early life trauma  Medical:  Psychosocial:  Difficult relationship with her grandmother  PLAN: 1. Continue medications at current dose 2. Continue to encourage unit groups and activities 3. Continue to monitor mood, behavior and interaction with  peers 4.  SW would facilitate residential care   Artist [place], MD 01/15/2017, 11:23 AM

## 2017-01-15 NOTE — Progress Notes (Signed)
DAR NOTE: Patient presents with anxious affect and depressed mood. Pt has been isolative this morning, pt stated the noise around reminds her of stuff. Pt got this after, went to the dayroom and was observed interacting with peers. Pt stated she is feeling better and was happy to be up. Denies pain, auditory and visual hallucinations.  Rates depression at 5, hopelessness at 4, and anxiety at 5.  Maintained on routine safety checks.  Medications given as prescribed.  Support and encouragement offered as needed.  Attended group and participated. Will continue to monitor.

## 2017-01-16 MED ORDER — QUETIAPINE FUMARATE 300 MG PO TABS
300.0000 mg | ORAL_TABLET | Freq: Every day | ORAL | Status: DC
  Administered 2017-01-16: 300 mg via ORAL
  Filled 2017-01-16 (×3): qty 1

## 2017-01-16 MED ORDER — QUETIAPINE FUMARATE 25 MG PO TABS
25.0000 mg | ORAL_TABLET | Freq: Every day | ORAL | Status: DC
  Administered 2017-01-16 – 2017-01-17 (×2): 25 mg via ORAL
  Filled 2017-01-16 (×3): qty 1

## 2017-01-16 NOTE — Progress Notes (Signed)
[facility] MD Progress Note  01/16/2017 1:56 PM Deborah Fischer  MRN:  [id number] Subjective:   21 y.o AAF, single, lives with her grandmother, unemployed. Background history of Bipolar Disorder, repeated trauma and SUD. Presented in company of her grand mother. Reported suicidal thoughts at presentation.  UDS was positive for THC.  Chart reviewed today. Patient discussed at team today.  Staff reports that patient is isolating a bit. Interacts at times with peers. She continues to report anxiety. Has expressed desire for more Seroquel as it helps her. She has not been observed to be internally stimulated.   Seen today. Says she is gradually getting better but still feels nervous at times. Says eating around people is anxiety provoking for her. She feels judged during that setting. Says she has two legal charges for destruction of property and a pending DUI. Patient says she is tired of living the way she used to live. Says she wants to change her life. Says her grand mother has offered to accommodate her upon discharge. Says her grand mother notes she looks better. Patient says she genuinely wants to stay away from drugs and hopefully get back to school. Says her sleep is slightly broken. No sedation or dizziness during the day. No other side effects from her medications. No abnormal perception. No grandiosity or any other delusional preoccupation.  No suicidal or homicidal thoughts. No thoughts of violence.    Principal Problem: Bipolar disorder (HCC) Diagnosis:   Patient Active Problem List   Diagnosis Date Noted  . Bipolar disorder (HCC) [F31.9] 01/14/2017  . Alcohol use disorder, moderate, dependence (HCC) [F10.20] 01/14/2017   Total Time spent with patient: 20 minutes  Past Psychiatric History: As in H&P  Past Medical History:  Past Medical History:  Diagnosis Date  . Manic depression (HCC)     Past Surgical History:  Procedure Laterality Date  . WISDOM TOOTH EXTRACTION     Family  History: History reviewed. No pertinent family history. Family Psychiatric  History: As in H&P Social History:  History  Alcohol Use  . Yes    Comment: Daily. Beer: 3-4 bottles of beer. Liquor 1/2 5th (2-3 times a week) Last drink: 2 hours ago,.      History  Drug Use  . Types: Marijuana, Cocaine    Comment: Last marijuana: Last night, Last Crack: One week ago     Social History   Social History  . Marital status: Single    Spouse name: N/A  . Number of children: N/A  . Years of education: N/A   Social History Main Topics  . Smoking status: Current Every Day Smoker    Packs/day: 0.10    Types: Cigarettes  . Smokeless tobacco: Never Used  . Alcohol use Yes     Comment: Daily. Beer: 3-4 bottles of beer. Liquor 1/2 5th (2-3 times a week) Last drink: 2 hours ago,.   . Drug use: Yes    Types: Marijuana, Cocaine     Comment: Last marijuana: Last night, Last Crack: One week ago   . Sexual activity: No   Other Topics Concern  . None   Social History Narrative  . None   Additional Social History:    Pain Medications: see MAR Prescriptions: see MAR Over the Counter: see MAR History of alcohol / drug use?: Yes Negative Consequences of Use: Personal relationships, Financial Withdrawal Symptoms: Agitation, Sweats, Nausea / Vomiting Name of Substance 1: alcohoh 1 - Age of First Use: 15 1 - Amount (size/oz):  varies 1 - Frequency: daily 1 - Duration: 5 years 1 - Last Use / Amount: 01/13/2017 Name of Substance 2: marijuana 2 - Age of First Use: 15 2 - Amount (size/oz): varies 2 - Frequency: daily 2 - Duration: 5 years 2 - Last Use / Amount: 01/13/2017                Sleep: Slightly broken  Appetite:  Fair  Current Medications: Current Facility-Administered Medications  Medication Dose Route Frequency Provider Last Rate Last Dose  . acetaminophen (TYLENOL) tablet 650 mg  650 mg Oral Q6H PRN Kerry Hough, PA-C      . alum & mag hydroxide-simeth  (MAALOX/MYLANTA) 200-200-20 MG/5ML suspension 30 mL  30 mL Oral Q4H PRN Donell Sievert E, PA-C      . hydrOXYzine (ATARAX/VISTARIL) tablet 25 mg  25 mg Oral Q6H PRN Donell Sievert E, PA-C   25 mg at 01/14/17 2205  . loperamide (IMODIUM) capsule 2-4 mg  2-4 mg Oral PRN Kerry Hough, PA-C      . LORazepam (ATIVAN) tablet 1 mg  1 mg Oral Q6H PRN Kerry Hough, PA-C   1 mg at 01/14/17 8295  . LORazepam (ATIVAN) tablet 1 mg  1 mg Oral BID Donell Sievert E, PA-C       Followed by  . [START ON 01/18/2017] LORazepam (ATIVAN) tablet 1 mg  1 mg Oral Daily Simon, Spencer E, PA-C      . magnesium hydroxide (MILK OF MAGNESIA) suspension 30 mL  30 mL Oral Daily PRN Kerry Hough, PA-C      . multivitamin with minerals tablet 1 tablet  1 tablet Oral Daily Kerry Hough, PA-C   1 tablet at 01/16/17 0950  . nicotine (NICODERM CQ - dosed in mg/24 hours) patch 21 mg  21 mg Transdermal Daily Cobos, Rockey Situ, MD   21 mg at 01/16/17 0952  . ondansetron (ZOFRAN-ODT) disintegrating tablet 4 mg  4 mg Oral Q6H PRN Kerry Hough, PA-C      . QUEtiapine (SEROQUEL) tablet 200 mg  200 mg Oral QHS Izediuno, Delight Ovens, MD   200 mg at 01/15/17 2127  . thiamine (B-1) injection 100 mg  100 mg Intramuscular Once Donell Sievert E, PA-C      . thiamine (VITAMIN B-1) tablet 100 mg  100 mg Oral Daily Donell Sievert E, PA-C   100 mg at 01/16/17 0950  . traZODone (DESYREL) tablet 50 mg  50 mg Oral QHS,MR X 1 Kerry Hough, PA-C   50 mg at 01/15/17 2127    Lab Results:  No results found for this or any previous visit (from the past 48 hour(s)).  Blood Alcohol level:  Lab Results  Component Value Date   ETH <5 01/13/2017   ETH 207 (H) 12/13/2016    Metabolic Disorder Labs: No results found for: HGBA1C, MPG No results found for: PROLACTIN No results found for: CHOL, TRIG, HDL, CHOLHDL, VLDL, LDLCALC  Physical Findings: AIMS: Facial and Oral Movements Muscles of Facial Expression: None, normal Lips and  Perioral Area: None, normal Jaw: None, normal Tongue: None, normal,Extremity Movements Upper (arms, wrists, hands, fingers): None, normal Lower (legs, knees, ankles, toes): None, normal, Trunk Movements Neck, shoulders, hips: None, normal, Overall Severity Severity of abnormal movements (highest score from questions above): None, normal Incapacitation due to abnormal movements: None, normal Patient's awareness of abnormal movements (rate only patient's report): No Awareness, Dental Status Current problems with teeth and/or dentures?: No  Does patient usually wear dentures?: No  CIWA:  CIWA-Ar Total: 0 COWS:     Musculoskeletal: Strength & Muscle Tone: within normal limits Gait & Station: normal Patient leans: N/A  Psychiatric Specialty Exam: Physical Exam  ROS  Blood pressure 112/69, pulse 96, temperature 97.9 F (36.6 C), temperature source Oral, resp. rate 16, height  (1.676 m), weight 60.8 kg (134 lb), last menstrual period 12/30/2016, SpO2 100 %.Body mass index is 21.63 kg/m.  General Appearance: Neatly dressed, a bit tense. Good relatedness. Appropriate behavior. Not internally distracted.   Eye Contact:  Good  Speech:  Slightly pressured.  Volume:  Normal  Mood:  Still has some internal restlessness   Affect:  Restricted and mood congruent  Thought Process:  Goal directed   Orientation:  Full (Time, Place, and Person)  Thought Content:  Worried about being rejected again when she moves in with her grandmother. No delusional theme. No preoccupation with violent thoughts. No obsession.  No hallucination in any modality.   Suicidal Thoughts:  No  Homicidal Thoughts:  No  Memory:  Immediate;   Good Recent;   Good Remote;   Good  Judgement:  Better  Insight:  Good  Psychomotor Activity:  Normal  Concentration:  Concentration: Good and Attention Span: Good  Recall:  Good  Fund of Knowledge:  Good  Language:  Good  Akathisia:  Negative  Handed:    AIMS (if  indicated):     Assets:  Communication Skills Desire for Improvement Physical Health Resilience  ADL's:  Intact  Cognition:  WNL  Sleep:  Number of Hours: 6     Treatment Plan Summary:  Patient still has features of hypomania. We discussed further titration of her Seroquel. Patient consented to changes below.   Psychiatric: Bipolar Disorder SUD Early life trauma  Medical:  Psychosocial:  Difficult relationship with her grandmother  PLAN: 1. Increase Seroquel to 300 mg HS 2. Seroquel 25 mg daily 3. Wean off Ativan 4. Continue to monitor mood, behavior and interaction with peers    Georgiann Cocker, MD 01/16/2017, 1:56 PMPatient ID: Deborah Fischer, female   DOB: 1995-12-30, 20 y.o.   MRN: [id number]

## 2017-01-16 NOTE — Plan of Care (Signed)
Problem: Education: Goal: Knowledge of [facility] Education information/materials will improve Outcome: Progressing Patient indicates understanding of [facility] Education information and asks appropriate questions.

## 2017-01-16 NOTE — Progress Notes (Signed)
[facility] Group Notes:  (Nursing/MHT/Case Management/Adjunct)  Date:  01/16/2017  Time: 2045 Type of Therapy:  wrap up group  Participation Level:  Active  Participation Quality:  Appropriate, Attentive, Sharing and Supportive  Affect:  Appropriate  Cognitive:  Appropriate  Insight:  Improving  Engagement in Group:  Distracting, Engaged and Supportive  Modes of Intervention:  Clarification, Education and Support  Summary of Progress/Problems: Pt joked a lot during group seeming to not be able to take things very seriously. Pt did participate and shared that if she could change any one thing in her life it would be her anxiety all day, every day. Pt is grateful of the judgement free zone here at the hospital but shared before coming here she wasn't grateful for anything in her life.   Marcille Buffy 01/16/2017, 9:53 PM

## 2017-01-16 NOTE — Progress Notes (Signed)
D: Patient has been visible in the milieu today.  Patient complains of some ongoing anxiety and poor sleep.  She denies any thoughts of self harm and "I feel like I'm doing better."  She denies any withdrawal symptoms at this time. Patient was asked to fill out self inventory, however, she did not turn one in.  Her seroquel has been changed so she may have it during the day.  Patient has been attending some group activities, however, has a tendency to isolate.  She is observed in the day room braiding a peer's hair.   A: Continue to monitor medication management and MD orders.  Safety checks continued every 15 minutes per protocol.  Offer support and encouragement as needed. R: Patient is receptive to staff; her behavior is appropriate.

## 2017-01-16 NOTE — [facility] Group Notes (Signed)
LCSW Group Therapy Note  01/16/2017 1:15pm  Type of Therapy and Topic:  Group Therapy: Avoiding Self-Sabotaging and Enabling Behaviors  Participation Level:  Active   Description of Group:   In this group, patients will learn how to identify obstacles, self-sabotaging and enabling behaviors, as well as: what are they, why do we do them and what needs these behaviors meet. Discuss unhealthy relationships and how to have positive healthy boundaries with those that sabotage and enable. Explore aspects of self-sabotage and enabling in yourself and how to limit these self-destructive behaviors in everyday life.   Therapeutic Goals: 1. Patient will identify one obstacle that relates to self-sabotage and enabling behaviors 2. Patient will identify one personal self-sabotaging or enabling behavior they did prior to admission 3. Patient will state a plan to change the above identified behavior 4. Patient will demonstrate ability to communicate their needs through discussion and/or role play.   Summary of Patient Progress:  Deborah Fischer was attentive and engaged during today's processing group. She shared that she is [facility] and is hoping to continue outpatient treatment including attending mental health groups and AA/NA. She continues to show progress in the group setting with improving insight.    Therapeutic Modalities:   Cognitive Behavioral Therapy Person-Centered Therapy Motivational Interviewing   [employer], LCSW 01/16/2017 3:31 PM

## 2017-01-16 NOTE — Progress Notes (Signed)
Pt reports she is still feeling very anxious, but it has decreased from this morning.  She feels she needs the Seroquel throughout the day and not just at night.  She denies SI/HI/AVH at this time.  She denies having any withdrawal symptoms.  She has been pleasant and appropriate on the unit this evening.  She voices no other needs or concerns with Clinical research associate.  Support and encouragement offered.  Discharge plans are in process.  Safety maintained with q15 minute checks.

## 2017-01-16 NOTE — Progress Notes (Signed)
Patient ID: Deborah Fischer, female   DOB: Jun 20, 1995, 20 y.o.   MRN: [id number]   STAFF OBSERVED IN DAY ROOM SOCIALIZING W/ PEERS.  PT ADVISED REALLY ENJOYING SOCIALIZING WITH PEERS AND ABLE TO RELATE WITH THEM.  PT DENIES SI/HI.  PT VERY ANXIOUS ABOUT BEING HOMELESS.  PT REPORTS GRANDMA HAS CONTRACTED WITH HER TO STAY WITH HER. PT WITH CONCERNS ABOUT RESIDING W/ GRANDMA.  PT WAS ENCOURAGED TO SPEAK WITH CASE WORKER ABOUT HER CONCERNS.  PT NAMED SOME COPING MECHANISMS THAT HAS BEEN HELPINGTO HER WITH REDUCING ANXTIETY WHILE IN HOSPITAL.  PT WAS PLEASANT, CALM AND COOPERATIVE.  SUPPORT WAS OFFERED TO PT.  PT SAFE FOR 15 MIN CHECK.

## 2017-01-17 MED ORDER — QUETIAPINE FUMARATE 400 MG PO TABS
400.0000 mg | ORAL_TABLET | Freq: Every day | ORAL | Status: DC
  Administered 2017-01-17: 400 mg via ORAL
  Filled 2017-01-17: qty 1
  Filled 2017-01-17 (×2): qty 7
  Filled 2017-01-17: qty 1

## 2017-01-17 NOTE — Progress Notes (Signed)
Data. Patient denies SI/HI/AVH. Verbally contracts for safety on the unit and to come to staff before acting of any self harm thoughts/feelings. Patient slept late this morning, missing her AM medications. She was irritable and minimal in her interactions. She avoided eye contact and stood sideways at the med window and only grunted and had one word answers to questions. Patient refused to complete her self assessment this shift. Patient did spend a significant amount of time with the peer counselor this afternoon. She was able to express her anger at her father and grandparents and she was able to give very positive goals for her life. Action. Emotional support and encouragement offered. Education provided on medication, indications and side effect. Q 15 minute checks done for safety. Response. Safety on the unit maintained through 15 minute checks.

## 2017-01-17 NOTE — Progress Notes (Signed)
[facility] MD Progress Note  01/17/2017 10:46 AM Deborah Fischer  MRN:  [id number] Subjective:   21 y.o AAF, single, lives with her grandmother, unemployed. Background history of Bipolar Disorder, repeated trauma and SUD. Presented in company of her grand mother. Reported suicidal thoughts at presentation.  UDS was positive for THC.  Chart reviewed today. Patient discussed at team today.  Staff reports that she has been interacting more with peers. She has not voiced any suicidal or homicidal thoughts. She is pleased her grandma would take her back but says it is anxiety provoking living with her grandma again. She has been taking her medications as prescribed.   Seen today. Had been socializing with peers today. Later heard her crying. When approached she was upset and did not want to talk about it. Says she just wants to be discharged as is does not make any sense being here. Repeated effort to explore her stressor was futile. Patient later came out and started interacting with her peers again.    Principal Problem: Bipolar disorder (HCC) Diagnosis:   Patient Active Problem List   Diagnosis Date Noted  . Bipolar disorder (HCC) [F31.9] 01/14/2017  . Alcohol use disorder, moderate, dependence (HCC) [F10.20] 01/14/2017   Total Time spent with patient: 20 minutes  Past Psychiatric History: As in H&P  Past Medical History:  Past Medical History:  Diagnosis Date  . Manic depression (HCC)     Past Surgical History:  Procedure Laterality Date  . WISDOM TOOTH EXTRACTION     Family History: History reviewed. No pertinent family history. Family Psychiatric  History: As in H&P Social History:  History  Alcohol Use  . Yes    Comment: Daily. Beer: 3-4 bottles of beer. Liquor 1/2 5th (2-3 times a week) Last drink: 2 hours ago,.      History  Drug Use  . Types: Marijuana, Cocaine    Comment: Last marijuana: Last night, Last Crack: One week ago     Social History   Social History  .  Marital status: Single    Spouse name: N/A  . Number of children: N/A  . Years of education: N/A   Social History Main Topics  . Smoking status: Current Every Day Smoker    Packs/day: 0.10    Types: Cigarettes  . Smokeless tobacco: Never Used  . Alcohol use Yes     Comment: Daily. Beer: 3-4 bottles of beer. Liquor 1/2 5th (2-3 times a week) Last drink: 2 hours ago,.   . Drug use: Yes    Types: Marijuana, Cocaine     Comment: Last marijuana: Last night, Last Crack: One week ago   . Sexual activity: No   Other Topics Concern  . None   Social History Narrative  . None   Additional Social History:    Pain Medications: see MAR Prescriptions: see MAR Over the Counter: see MAR History of alcohol / drug use?: Yes Negative Consequences of Use: Personal relationships, Financial Withdrawal Symptoms: Agitation, Sweats, Nausea / Vomiting Name of Substance 1: alcohoh 1 - Age of First Use: 15 1 - Amount (size/oz): varies 1 - Frequency: daily 1 - Duration: 5 years 1 - Last Use / Amount: 01/13/2017 Name of Substance 2: marijuana 2 - Age of First Use: 15 2 - Amount (size/oz): varies 2 - Frequency: daily 2 - Duration: 5 years 2 - Last Use / Amount: 01/13/2017                Sleep: Slept well  last night  Appetite: Good  Current Medications: Current Facility-Administered Medications  Medication Dose Route Frequency Provider Last Rate Last Dose  . acetaminophen (TYLENOL) tablet 650 mg  650 mg Oral Q6H PRN Kerry Hough, PA-C      . alum & mag hydroxide-simeth (MAALOX/MYLANTA) 200-200-20 MG/5ML suspension 30 mL  30 mL Oral Q4H PRN Donell Sievert E, PA-C      . hydrOXYzine (ATARAX/VISTARIL) tablet 25 mg  25 mg Oral Q6H PRN Donell Sievert E, PA-C   25 mg at 01/14/17 2205  . magnesium hydroxide (MILK OF MAGNESIA) suspension 30 mL  30 mL Oral Daily PRN Kerry Hough, PA-C      . multivitamin with minerals tablet 1 tablet  1 tablet Oral Daily Kerry Hough, PA-C   1 tablet at  01/16/17 0950  . nicotine (NICODERM CQ - dosed in mg/24 hours) patch 21 mg  21 mg Transdermal Daily Cobos, Rockey Situ, MD   21 mg at 01/16/17 0952  . QUEtiapine (SEROQUEL) tablet 25 mg  25 mg Oral Daily Izediuno, Delight Ovens, MD   25 mg at 01/16/17 1517  . QUEtiapine (SEROQUEL) tablet 300 mg  300 mg Oral QHS Izediuno, Delight Ovens, MD   300 mg at 01/16/17 2135  . thiamine (B-1) injection 100 mg  100 mg Intramuscular Once Donell Sievert E, PA-C      . thiamine (VITAMIN B-1) tablet 100 mg  100 mg Oral Daily Donell Sievert E, PA-C   100 mg at 01/16/17 0950  . traZODone (DESYREL) tablet 50 mg  50 mg Oral QHS,MR X 1 Kerry Hough, PA-C   50 mg at 01/16/17 2134    Lab Results:  No results found for this or any previous visit (from the past 48 hour(s)).  Blood Alcohol level:  Lab Results  Component Value Date   ETH <5 01/13/2017   ETH 207 (H) 12/13/2016    Metabolic Disorder Labs: No results found for: HGBA1C, MPG No results found for: PROLACTIN No results found for: CHOL, TRIG, HDL, CHOLHDL, VLDL, LDLCALC  Physical Findings: AIMS: Facial and Oral Movements Muscles of Facial Expression: None, normal Lips and Perioral Area: None, normal Jaw: None, normal Tongue: None, normal,Extremity Movements Upper (arms, wrists, hands, fingers): None, normal Lower (legs, knees, ankles, toes): None, normal, Trunk Movements Neck, shoulders, hips: None, normal, Overall Severity Severity of abnormal movements (highest score from questions above): None, normal Incapacitation due to abnormal movements: None, normal Patient's awareness of abnormal movements (rate only patient's report): No Awareness, Dental Status Current problems with teeth and/or dentures?: No Does patient usually wear dentures?: No  CIWA:  CIWA-Ar Total: 1 COWS:     Musculoskeletal: Strength & Muscle Tone: within normal limits Gait & Station: normal Patient leans: N/A  Psychiatric Specialty Exam: Physical Exam  Constitutional:  She is oriented to person, place, and time. She appears well-developed and well-nourished.  HENT:  Head: Normocephalic and atraumatic.  Respiratory: Effort normal.  Neurological: She is alert and oriented to person, place, and time.  Psychiatric:  As above    ROS  Blood pressure (!) 114/59, pulse (!) 121, temperature (!) 97.5 F (36.4 C), temperature source Oral, resp. rate 16, height  (1.676 m), weight 60.8 kg (134 lb), last menstrual period 12/30/2016, SpO2 100 %.Body mass index is 21.63 kg/m.  General Appearance: Distressed. Refused to engage  Eye Contact:  Good  Speech:  Normal prosody  Volume:  Normal  Mood:  Irritable   Affect:  Restricted  and mood congruent  Thought Process:  Goal directed   Orientation:  Full (Time, Place, and Person)  Thought Content: Unable to assess at this time.  Suicidal Thoughts:  Unable to assess at this time.   Homicidal Thoughts:  Unable to assess at this time.   Memory:  Unable to assess at this time.   Judgement:  Unable to assess at this time.   Insight:  Unable to assess at this time.   Psychomotor Activity:  Normal  Concentration:  Unable to assess at this time.   Recall:  Unable to assess at this time.   Fund of Knowledge:  Unable to assess at this time.   Language:  Good  Akathisia:  Negative  Handed:    AIMS (if indicated):     Assets:  Communication Skills Desire for Improvement Physical Health Resilience  ADL's:  Intact  Cognition:  WNL  Sleep:  Number of Hours: 6     Treatment Plan Summary:  Patient was dysphoric this morning. We would adjusted her medications again today and evaluate her further.   Psychiatric: Bipolar Disorder SUD Early life trauma  Medical:  Psychosocial:  Difficult relationship with her grandmother  PLAN: 1. Increase Seroquel to 400 mg HS 2. Continue to monitor mood, behavior and interaction with peers    Georgiann Cocker, MD 01/17/2017, 10:46 AMPatient ID: Deborah Fischer,  female   DOB: [birth year], 20 y.o.   MRN: [id number] Patient ID: Deborah Fischer, female   DOB: 05/20/1995, 20 y.o.   MRN: [id number]

## 2017-01-17 NOTE — [facility] Suicide Risk Assessment (Signed)
[facility] INPATIENT:  Family/Significant Other Suicide Prevention Education  Suicide Prevention Education:  Contact Attempts: Arica Bevilacqua (sister [phone]), has been identified by the patient as the family member/significant other with whom the patient will be residing, and identified as the person(s) who will aid the patient in the event of a mental health crisis.  With written consent from the patient, two attempts were made to provide suicide prevention education, prior to and/or following the patient's discharge.  We were unsuccessful in providing suicide prevention education.  A suicide education pamphlet was given to the patient to share with family/significant other.  Date and time of first attempt: 01/17/17 at 3:00pm. No answer, CSW left a message.    Jonathon Jordan, MSW, LCSWA  01/17/2017, 3:00 PM

## 2017-01-17 NOTE — Progress Notes (Signed)
D.  Pt pleasant on approach, complaint of anxiety.  Pt was positive for evening AA group, observed engaged in appropriate interaction with peers on the unit.  Pt denies SI/HI/AVH at this time.  A.  Support and encouragement offered, medication given as ordered  R.  Pt remains safe on the unit, will continue to monitor.   

## 2017-01-17 NOTE — Progress Notes (Signed)
Pt attended AA group this evening.  

## 2017-01-17 NOTE — Progress Notes (Signed)
Adult Psychoeducational Group Note  Date:  01/17/2017 Time:  10:48 AM  Group Topic/Focus:  Healthy Communication:   The focus of this group is to discuss communication, barriers to communication, as well as healthy ways to communicate with others.  Participation Level:  None  Participation Quality:  Inattentive  Affect:  Depressed and Flat  Cognitive:  Lacking  Insight: None  Engagement in Group:  Poor  Modes of Intervention:  Activity  Additional Comments:  Pt did not participate in any group activities today, but did attend group.  Deeya Richeson R Phuong Hillary 01/17/2017, 10:48 AM

## 2017-01-17 NOTE — Plan of Care (Signed)
Problem: Activity: Goal: Interest or engagement in activities will improve Outcome: Progressing Some attendance in unit activities. Goal: Sleeping patterns will improve Outcome: Not Progressing Sleeping late.  Problem: Coping: Goal: Ability to verbalize frustrations and anger appropriately will improve Outcome: Progressing Patient has been appropriate in her expression of frustrations. Goal: Ability to demonstrate self-control will improve Outcome: Progressing Behavior has been appropriate throughout shift.  Problem: Safety: Goal: Periods of time without injury will increase Outcome: Progressing No injury this shift reported/observed.

## 2017-01-17 NOTE — Progress Notes (Signed)
Recreation Therapy Notes  Date: 01/17/17 Time: 0930 Location: 500 Hall Dayroom  Group Topic: Stress Management  Goal Area(s) Addresses:  Patient will verbalize importance of using healthy stress management.  Patient will identify positive emotions associated with healthy stress management.   Intervention: Stress Management  Activity :  Guided Imagery.  LRT introduced the stress management technique of guided imagery.  Patients were to listen and follow along as LRT read a script to lead patients on a mental vacation through a meadow.    Education:  Stress Management, Discharge Planning.   Education Outcome: Acknowledges edcuation/In group clarification offered/Needs additional education  Clinical Observations/Feedback: Pt did not attend group.    Caroll Rancher, LRT/CTRS         Lillia Abed, Cataldo Cosgriff A 01/17/2017 11:21 AM

## 2017-01-17 NOTE — [facility] Group Notes (Signed)
LCSW Group Therapy Note  01/17/2017 1:15pm  Type of Therapy and Topic:  Group Therapy:  Feelings around Relapse and Recovery  Participation Level: Pt invited. Did not attend. Walked in the room as group was closing.   Jonathon Jordan, MSW, LCSWA 01/17/2017 2:21 PM

## 2017-01-18 MED ORDER — TRAZODONE HCL 50 MG PO TABS: 50.0000 mg | ORAL_TABLET | Freq: Every evening | ORAL | 0 refills | Status: DC | PRN

## 2017-01-18 MED ORDER — QUETIAPINE FUMARATE 400 MG PO TABS: 400.0000 mg | ORAL_TABLET | Freq: Every day | ORAL | 0 refills | Status: DC

## 2017-01-18 MED ORDER — NICOTINE 21 MG/24HR TD PT24: 21.0000 mg | MEDICATED_PATCH | Freq: Every day | TRANSDERMAL | 0 refills | Status: DC

## 2017-01-18 NOTE — Progress Notes (Addendum)
Data. Patient denies SI/HI/AVH. Verbally contracts for safety on the unit and to come to staff before acting of any self harm thoughts/feeling.  Patient interacting well with staff and other patients. Affect is brighter this shift. Patient's eye contact is good and she has been smiling and laughing often. She does report anxiety, "Because I am going home. I am worried that my Olene Floss will decide to make me leave, and I don't want to go back to [employer]. I don't feel safe there." On her self assessment she reports 9/10 for anxiety, 3/10 for depression and 0/10 for hopelessness.  Action. Emotional support and encouragement offered. Education provided on medication, indications and side effect. Q 15 minute checks done for safety. Response. Safety on the unit maintained through 15 minute checks.  Medications taken as prescribed. Attended groups. Remained calm and appropriate through out shift.  Pt. discharged to lobby.  Belongings sheet reviewed and signed by pt. and all belongings sent home. Paperwork reviewed and pt. able to verbalize understanding of education. Patient left with sample medications and medication scripts. Pt. in no current distress and ambulatory.
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
## 2017-01-18 NOTE — Progress Notes (Signed)
  [facility] Adult Case Management Discharge Plan :  Will you be returning to the same living situation after discharge:  No.  Will be going to live with grandmother and grandfather At discharge, do you have transportation home?: Yes,  grandmother Do you have the ability to pay for your medications: No.  [facility] to help  Release of information consent forms completed and turned in to Medical Records.  Patient to Follow up at: Follow-up Information    Services, Alcohol And Drug Follow up.   Specialty:  Behavioral Health Why:  Please go as a walk-in within 3 days of discharge to be established for outpatient services, including SA IOP. Walk-in hours are Mon, Wed, Fri 12pm-3pm. Please arrive as early as possible to be sure that you are seen. Thank you. Contact information: [address] [place] [address] [phone]        [facility] Follow up.   Why:  [facility] is open Monday-Friday 8am-5pm.  Please present there within 2-3 days after discharge to have a full assessment and be started on medication management services. Contact information: [address] [place] [address] [phone]           Next level of care provider has access to [facility] Behavioral Health Services Link:no  Safety Planning and Suicide Prevention discussed: Yes,  with grandmother  Have you used any form of tobacco in the last 30 days? (Cigarettes, Smokeless Tobacco, Cigars, and/or Pipes): Yes  Has patient been referred to the Quitline?: Patient refused referral  Patient has been referred for addiction treatment: Yes  Lynnell Chad, LCSW 01/18/2017, 11:28 AM

## 2017-01-18 NOTE — [facility] Group Notes (Signed)
[facility] Group Notes: (Clinical Social Work)   01/18/2017   1:15-2:15pm   Type of Therapy:  Group Therapy   Participation Level:  Did Not Attend - already discharged   Ambrose Mantle, LCSW 01/18/2017, 2:55 PM

## 2017-03-05 ENCOUNTER — Telehealth (HOSPITAL_COMMUNITY): Payer: Self-pay

## 2017-09-17 ENCOUNTER — Other Ambulatory Visit: Payer: Self-pay

## 2017-09-17 ENCOUNTER — Encounter (HOSPITAL_COMMUNITY): Payer: Self-pay | Admitting: *Deleted

## 2017-09-17 ENCOUNTER — Inpatient Hospital Stay (HOSPITAL_COMMUNITY)
Admission: AD | Admit: 2017-09-17 | Discharge: 2017-09-22 | DRG: 885 | Disposition: A | Discharge status: Home / Self Care | Payer: No Typology Code available for payment source | Source: Intra-hospital | Attending: Psychiatry | Admitting: Psychiatry

## 2017-09-17 ENCOUNTER — Encounter (HOSPITAL_COMMUNITY): Payer: Self-pay | Admitting: Behavioral Health

## 2017-09-17 ENCOUNTER — Emergency Department (HOSPITAL_COMMUNITY)
Admission: EM | Admit: 2017-09-17 | Discharge: 2017-09-17 | Disposition: A | Discharge status: Other Acute Inpatient Hospital | Payer: Self-pay | Attending: Emergency Medicine | Admitting: Emergency Medicine

## 2017-09-17 DIAGNOSIS — F314 Bipolar disorder, current episode depressed, severe, without psychotic features: Principal | ICD-10-CM | POA: Diagnosis present

## 2017-09-17 DIAGNOSIS — F419 Anxiety disorder, unspecified: Secondary | ICD-10-CM | POA: Diagnosis not present

## 2017-09-17 DIAGNOSIS — R45851 Suicidal ideations: Secondary | ICD-10-CM | POA: Diagnosis not present

## 2017-09-17 DIAGNOSIS — Z91018 Allergy to other foods: Secondary | ICD-10-CM | POA: Diagnosis not present

## 2017-09-17 DIAGNOSIS — Z811 Family history of alcohol abuse and dependence: Secondary | ICD-10-CM | POA: Diagnosis not present

## 2017-09-17 DIAGNOSIS — G4700 Insomnia, unspecified: Secondary | ICD-10-CM | POA: Diagnosis not present

## 2017-09-17 DIAGNOSIS — F1290 Cannabis use, unspecified, uncomplicated: Secondary | ICD-10-CM | POA: Diagnosis not present

## 2017-09-17 DIAGNOSIS — F1490 Cocaine use, unspecified, uncomplicated: Secondary | ICD-10-CM | POA: Diagnosis not present

## 2017-09-17 DIAGNOSIS — F17210 Nicotine dependence, cigarettes, uncomplicated: Secondary | ICD-10-CM | POA: Diagnosis present

## 2017-09-17 DIAGNOSIS — Z5900 Homelessness unspecified: Secondary | ICD-10-CM

## 2017-09-17 DIAGNOSIS — F1010 Alcohol abuse, uncomplicated: Secondary | ICD-10-CM | POA: Diagnosis not present

## 2017-09-17 DIAGNOSIS — Z62898 Other specified problems related to upbringing: Secondary | ICD-10-CM | POA: Diagnosis not present

## 2017-09-17 DIAGNOSIS — F1424 Cocaine dependence with cocaine-induced mood disorder: Secondary | ICD-10-CM

## 2017-09-17 DIAGNOSIS — Z818 Family history of other mental and behavioral disorders: Secondary | ICD-10-CM | POA: Diagnosis not present

## 2017-09-17 DIAGNOSIS — F4310 Post-traumatic stress disorder, unspecified: Secondary | ICD-10-CM | POA: Diagnosis present

## 2017-09-17 DIAGNOSIS — Z880 Allergy status to penicillin: Secondary | ICD-10-CM | POA: Diagnosis not present

## 2017-09-17 DIAGNOSIS — Z6229 Other upbringing away from parents: Secondary | ICD-10-CM | POA: Diagnosis not present

## 2017-09-17 DIAGNOSIS — Z813 Family history of other psychoactive substance abuse and dependence: Secondary | ICD-10-CM | POA: Diagnosis not present

## 2017-09-17 DIAGNOSIS — F319 Bipolar disorder, unspecified: Secondary | ICD-10-CM | POA: Diagnosis present

## 2017-09-17 DIAGNOSIS — Z79899 Other long term (current) drug therapy: Secondary | ICD-10-CM | POA: Insufficient documentation

## 2017-09-17 DIAGNOSIS — F1410 Cocaine abuse, uncomplicated: Secondary | ICD-10-CM | POA: Insufficient documentation

## 2017-09-17 DIAGNOSIS — R300 Dysuria: Secondary | ICD-10-CM | POA: Diagnosis not present

## 2017-09-17 DIAGNOSIS — F1020 Alcohol dependence, uncomplicated: Secondary | ICD-10-CM | POA: Insufficient documentation

## 2017-09-17 LAB — ACETAMINOPHEN LEVEL: Acetaminophen (Tylenol), Serum: <10 ug/mL — ABNORMAL LOW (ref 10–30)

## 2017-09-17 LAB — COMPREHENSIVE METABOLIC PANEL
ALBUMIN: 3.8 g/dL (ref 3.5–5.0)
ALT: 13 U/L — ABNORMAL LOW (ref 14–54)
ANION GAP: 9 (ref 5–15)
AST: 16 U/L (ref 15–41)
Alkaline Phosphatase: 49 U/L (ref 38–126)
BILIRUBIN TOTAL: 0.4 mg/dL (ref 0.3–1.2)
BUN: 8 mg/dL (ref 6–20)
CO2: 25 mmol/L (ref 22–32)
Calcium: 8.6 mg/dL — ABNORMAL LOW (ref 8.9–10.3)
Chloride: 107 mmol/L (ref 101–111)
Creatinine, Ser: 0.79 mg/dL (ref 0.44–1.00)
GFR calc Af Amer: >60 mL/min (ref >60)
GFR calc non Af Amer: >60 mL/min (ref >60)
GLUCOSE: 75 mg/dL (ref 65–99)
Potassium: 3.9 mmol/L (ref 3.5–5.1)
SODIUM: 141 mmol/L (ref 135–145)
TOTAL PROTEIN: 7 g/dL (ref 6.5–8.1)

## 2017-09-17 LAB — CBC
HEMATOCRIT: 42.2 % (ref 36.0–46.0)
Hemoglobin: 13.9 g/dL (ref 12.0–15.0)
MCH: 31.2 pg (ref 26.0–34.0)
MCHC: 32.9 g/dL (ref 30.0–36.0)
MCV: 94.8 fL (ref 78.0–100.0)
Platelets: 323 10*3/uL (ref 150–400)
RBC: 4.45 MIL/uL (ref 3.87–5.11)
RDW: 12.2 % (ref 11.5–15.5)
WBC: 5.5 10*3/uL (ref 4.0–10.5)

## 2017-09-17 LAB — RAPID URINE DRUG SCREEN, HOSP PERFORMED
Amphetamines: NOT DETECTED
BARBITURATES: NOT DETECTED
Benzodiazepines: NOT DETECTED
Cocaine: POSITIVE — AB
Opiates: NOT DETECTED
Tetrahydrocannabinol: POSITIVE — AB

## 2017-09-17 LAB — PREGNANCY, URINE: Preg Test, Ur: NEGATIVE

## 2017-09-17 LAB — SALICYLATE LEVEL: Salicylate Lvl: <7 mg/dL (ref 2.8–30.0)

## 2017-09-17 LAB — ETHANOL: Alcohol, Ethyl (B): <10 mg/dL (ref <10)

## 2017-09-17 MED ORDER — MAGNESIUM HYDROXIDE 400 MG/5ML PO SUSP: 30.0000 mL | Freq: Every day | ORAL | Status: DC | PRN

## 2017-09-17 MED ORDER — ACETAMINOPHEN 325 MG PO TABS: 650.0000 mg | ORAL_TABLET | Freq: Four times a day (QID) | ORAL | Status: DC | PRN

## 2017-09-17 MED ORDER — ALUM & MAG HYDROXIDE-SIMETH 200-200-20 MG/5ML PO SUSP: 30.0000 mL | ORAL | Status: DC | PRN

## 2017-09-17 MED ORDER — TRAZODONE HCL 50 MG PO TABS
50.0000 mg | ORAL_TABLET | Freq: Every evening | ORAL | Status: DC | PRN
  Administered 2017-09-17 – 2017-09-19 (×3): 50 mg via ORAL
  Filled 2017-09-17 (×10): qty 1

## 2017-09-17 NOTE — ED Notes (Signed)
Report given to [facility].  [facility] called for transport.

## 2017-09-17 NOTE — Progress Notes (Signed)
This is a voluntary admission for this patient ( 21yo single homeless black female) who is admitted to [facility] for " help with my drug habit". At this time, pt is avoiding eye contact. She says to this writer " I've been here before". Her history is as follows: she says she has a " bipolar disorder...but I don't take medicine", that she slef medicates with alcohol, pot, heroin , cocaine and crack" " but its no longer working" and says to this Clinical research associate " I just gotta get straightened out..." She is cooperative, reprots she has " bad" allergy to PCN and BANNANAS ( " my tongue swells up" and denies any other klnown medicalissue at this time. She reprots her Ermalinda Barrios family is her GM " Dietitian" and her sister " Jazzlynn". She contracts for safety...says she just needs to get some sl;eep" and is oriented to the hall  by this Clinical research associate. Marland Kitchen

## 2017-09-17 NOTE — BH Assessment (Signed)
Assessment Note  Deborah Fischer is a 22 y.o. female who presented to [facility] on a voluntary basis with complaint of suicidal ideation, despondency, and substance use.  Pt was last assessed by TTS in August 2018.    Pt recently moved back to [place] after breaking up with her boyfriend (the couple lived in [place]).  Pt reported as follows:  She stated that she has reflected on her life recently and has become increasingly despondent and suicidal.  Pt endorsed the following symptoms:  Suicidal ideation with plan to overdose; despondency; insomnia; guilt; hopelessness; recent self-injury (cutting and burning self with cigarette).  Pt also endorsed daily use of cocaine, crack cocaine, and marijuana use.  UDS was positive for cocaine and THC.  Pt requested inpatient treatment.  Pt recently moved from the [place] area.  She is unemployed and homeless.  Pt stated that she is trying to move back in with grandmother (also a [place] resident).  During assessment, Pt presented as alert and oriented.  She had good eye contact and was cooperative in session.  Pt was dressed in scrubs, and she appeared appropriately groomed.  Pt's mood was sad, and affect was mood-congruent.  Pt endorsed suicidal ideation and other depressive symptoms, as well as substance use.  Speech was normal in rate, rhythm, and volume.  Thought processes were within normal range, and thought content was logical and goal-oriented.  There was no evidence of delusion.  Pt's memory and concentration were intact..  Impulse control, judgment, and insight were fair.  Consulted with Molli Knock, NP, who determined that Pt meets inpt criteria -- 300 hall.  Diagnosis: Bipolar Disorder I; most recent type depressed; cocaine use disorder; cannabis use disorder  Past Medical History:  Past Medical History:  Diagnosis Date  . Manic depression (HCC)     Past Surgical History:  Procedure Laterality Date  . WISDOM TOOTH EXTRACTION      Family  History: No family history on file.  Social History:  reports that she has been smoking cigarettes.  She has been smoking about 0.10 packs per day. She has never used smokeless tobacco. She reports that she drinks alcohol. She reports that she has current or past drug history. Drugs: Marijuana and Cocaine. Frequency: 7.00 times per week.  Additional Social History:  Alcohol / Drug Use Pain Medications: See MAR Prescriptions: See MAR Over the Counter: See MAR History of alcohol / drug use?: Yes Substance #1 Name of Substance 1: Cocaine 1 - Age of First Use: 20 1 - Amount (size/oz): varied 1 - Frequency: daily 1 - Duration: ongoing 1 - Last Use / Amount: 09/16/17 Substance #2 Name of Substance 2: Marijuana 2 - Age of First Use: 15 2 - Amount (size/oz): 1-2 grams 2 - Frequency: daily 2 - Duration: ongoing 2 - Last Use / Amount: 09/16/17  CIWA: CIWA-Ar BP: 103/70 Pulse Rate: 63 COWS:    Allergies:  Allergies  Allergen Reactions  . Banana Anaphylaxis and Swelling  . Penicillins Anaphylaxis and Rash    Has patient had a PCN reaction causing immediate rash, facial/tongue/throat swelling, SOB or lightheadedness with hypotension: Unknown Has patient had a PCN reaction causing severe rash involving mucus membranes or skin necrosis: Unknown Has patient had a PCN reaction that required hospitalization: Unknown Has patient had a PCN reaction occurring within the last 10 years: Unknown If all of the above answers are "NO", then may proceed with Cephalosporin use.     Home Medications:  (Not in a  hospital admission)  OB/GYN Status:  Patient's last menstrual period was 09/06/2017 (exact date).  General Assessment Data Location of Assessment: [facility] ED TTS Assessment: In system Is this a Tele or Face-to-Face Assessment?: Face-to-Face Is this an Initial Assessment or a Re-assessment for this encounter?: Initial Assessment Marital status: Single Is patient pregnant?: No Pregnancy Status:  No Living Arrangements: Other (Comment)(Homeless) Can pt return to current living arrangement?: No Admission Status: Voluntary Is patient capable of signing voluntary admission?: Yes Referral Source: Self/Family/Friend Insurance type: Mpme     Crisis Care Plan Living Arrangements: Other (Comment)(Homeless) Name of Psychiatrist: None Name of Therapist: None  Education Status Is patient currently in school?: No Is the patient employed, unemployed or receiving disability?: Unemployed  Risk to self with the past 6 months Suicidal Ideation: Yes-Currently Present Has patient been a risk to self within the past 6 months prior to admission? : No Suicidal Intent: No-Not Currently/Within Last 6 Months Has patient had any suicidal intent within the past 6 months prior to admission? : No Is patient at risk for suicide?: Yes Suicidal Plan?: Yes-Currently Present Has patient had any suicidal plan within the past 6 months prior to admission? : No Specify Current Suicidal Plan: Overdose Access to Means: Yes Specify Access to Suicidal Means: street drugs What has been your use of drugs/alcohol within the last 12 months?: Cocaine, Crack Cocaine, Marijuana Previous Attempts/Gestures: No Intentional Self Injurious Behavior: Cutting, Burning Comment - Self Injurious Behavior: Recent history of cutting,, burning self Family Suicide History: No Recent stressful life event(s): Conflict (Comment), Other (Comment)(Recently tried to break up with boyfriend) Persecutory voices/beliefs?: No Depression: Yes Depression Symptoms: Despondent, Insomnia, Isolating, Guilt, Feeling worthless/self pity Substance abuse history and/or treatment for substance abuse?: Yes Suicide prevention information given to non-admitted patients: Not applicable  Risk to Others within the past 6 months Homicidal Ideation: No Does patient have any lifetime risk of violence toward others beyond the six months prior to admission?  : No Thoughts of Harm to Others: No Current Homicidal Intent: No Current Homicidal Plan: No Access to Homicidal Means: No History of harm to others?: No Assessment of Violence: None Noted Does patient have access to weapons?: No Criminal Charges Pending?: Yes Describe Pending Criminal Charges: Inj to personal property; DWI Does patient have a court date: Yes Court Date: 09/30/17 Is patient on probation?: Unknown  Psychosis Hallucinations: None noted Delusions: None noted  Mental Status Report Appearance/Hygiene: In scrubs, Unremarkable Eye Contact: Good Motor Activity: Freedom of movement, Unremarkable Speech: Logical/coherent Level of Consciousness: Alert Mood: Sad Affect: Appropriate to circumstance Anxiety Level: None Thought Processes: Coherent, Relevant Judgement: Partial Orientation: Person, Place, Time, Situation Obsessive Compulsive Thoughts/Behaviors: None  Cognitive Functioning Concentration: Good Memory: Recent Intact, Remote Intact Is patient IDD: No Is patient DD?: No Insight: Fair Impulse Control: Fair Appetite: Fair Sleep: Decreased Total Hours of Sleep: 5 Vegetative Symptoms: None  ADLScreening [facility] Assessment Services) Patient's cognitive ability adequate to safely complete daily activities?: Yes Patient able to express need for assistance with ADLs?: Yes Independently performs ADLs?: Yes (appropriate for developmental age)  Prior Inpatient Therapy Prior Inpatient Therapy: Yes Prior Therapy Dates: Sept 2018 Prior Therapy Facilty/Provider(s): [facility] Reason for Treatment: Depression  Prior Outpatient Therapy Prior Outpatient Therapy: No Does patient have an ACCT team?: No Does patient have Intensive In-House Services?  : No Does patient have [facility] services? : No Does patient have P4CC services?: No  ADL Screening (condition at time of admission) Patient's cognitive ability adequate to safely complete  daily activities?: Yes Is the patient  deaf or have difficulty hearing?: No Does the patient have difficulty seeing, even when wearing glasses/contacts?: No Does the patient have difficulty concentrating, remembering, or making decisions?: No Patient able to express need for assistance with ADLs?: Yes Does the patient have difficulty dressing or bathing?: No Independently performs ADLs?: Yes (appropriate for developmental age) Does the patient have difficulty walking or climbing stairs?: No Weakness of Legs: None Weakness of Arms/Hands: None  Home Assistive Devices/Equipment Home Assistive Devices/Equipment: None  Therapy Consults (therapy consults require a physician order) PT Evaluation Needed: No OT Evalulation Needed: No SLP Evaluation Needed: No Abuse/Neglect Assessment (Assessment to be complete while patient is alone) Abuse/Neglect Assessment Can Be Completed: Yes Physical Abuse: Denies Verbal Abuse: Denies Sexual Abuse: Denies Exploitation of patient/patient's resources: Denies Self-Neglect: Denies Values / Beliefs Cultural Requests During Hospitalization: None Spiritual Requests During Hospitalization: None Consults Spiritual Care Consult Needed: No Social Work Consult Needed: No Merchant navy officer (For Healthcare) Does Patient Have a Medical Advance Directive?: No Would patient like information on creating a medical advance directive?: No - Patient declined    Additional Information 1:1 In Past 12 Months?: No CIRT Risk: No Elopement Risk: No Does patient have medical clearance?: Yes     Disposition:  Disposition Initial Assessment Completed for this Encounter: Yes Disposition of Patient: Admit Type of inpatient treatment program: Adult(Per Molli Knock, NP, Pt meets inpt criteria -- 300 hall)  On Site Evaluation by:   Reviewed with Physician:    Dorris Fetch Lithzy Bernard 09/17/2017 11:24 AM

## 2017-09-17 NOTE — ED Triage Notes (Signed)
Patient arrived voluntary saying she has recently relapsed with her cocaine use.  She has already withdrawn from heroin, but her relapse has caused her to be depressed and have suicidal thoughts.  She has cut herself in the past and has scarring around her right ankle.  She reports thinking of cutting herself again.

## 2017-09-17 NOTE — Progress Notes (Signed)
Pt reports that she is here for detox so that she can go to [facility] on Monday.  She says that she already has arrangements to go there and then plans to live with her grandmother.  She denies having any withdrawal symptoms at this time.  She has passive suicidal thoughts, but contracts for safety.  She denies HI/AVH.  Conversation was minimal, and pt forwarded little in information.  She did take a shower before going to bed as it was noted on admission earlier that she had body odor.  She is pleasant and cooperative.  She was encouraged to make her needs known to staff.  Pt voiced no needs or concerns.  Support and encouragement offered.  Discharge plans are in process.  Safety maintained with q15 minute checks.

## 2017-09-17 NOTE — ED Provider Notes (Signed)
[facility]-EMERGENCY DEPT Provider Note   CSN: [id number] Arrival date & time: 09/17/17  4098     History   Chief Complaint Chief Complaint  Patient presents with  . Suicidal    HPI Deborah Fischer is a 22 y.o. female.  HPI   She is here for evaluation of suicidal ideation.  She recently moved back to [place] and is been staying in a shelter.  She relapsed with cocaine and heroin use, following detox, about 1 month ago.  This morning she was talking to her grandmother who agreed to let her stay with her, if she went through "detox."  The patient has had vague thoughts of suicide, mostly a feeling of not wanting to live.  She does not have an active suicidal plan but wonders if she might sometime take too much heroin and die.  She is not currently being treated for depression.  There are no other known modifying factors.  Past Medical History:  Diagnosis Date  . Manic depression (HCC)     Patient Active Problem List   Diagnosis Date Noted  . Bipolar disorder (HCC) 01/14/2017  . Alcohol use disorder, moderate, dependence (HCC) 01/14/2017    Past Surgical History:  Procedure Laterality Date  . WISDOM TOOTH EXTRACTION       OB History    Gravida  1   Para      Term      Preterm      AB      Living        SAB      TAB      Ectopic      Multiple      Live Births               Home Medications    Prior to Admission medications   Medication Sig Start Date End Date Taking? Authorizing Provider  QUEtiapine (SEROQUEL) 400 MG tablet Take 1 tablet (400 mg total) by mouth at bedtime. 01/18/17  Yes Oneta Rack, NP  traZODone (DESYREL) 50 MG tablet Take 1 tablet (50 mg total) by mouth at bedtime and may repeat dose one time if needed. 01/18/17  Yes Oneta Rack, NP  nicotine (NICODERM CQ - DOSED IN MG/24 HOURS) 21 mg/24hr patch Place 1 patch (21 mg total) onto the skin daily. Patient not taking: Reported on 09/17/2017 01/18/17    Oneta Rack, NP    Family History No family history on file.  Social History Social History   Tobacco Use  . Smoking status: Current Every Day Smoker    Packs/day: 0.10    Types: Cigarettes  . Smokeless tobacco: Never Used  Substance Use Topics  . Alcohol use: Yes    Comment: Daily. Beer: 3-4 bottles of beer. Liquor 1/2 5th (2-3 times a week) Last drink: 2 hours ago,.   . Drug use: Yes    Frequency: 7.0 times per week    Types: Marijuana, Cocaine    Comment: +THC and + COC     Allergies   Banana and Penicillins   Review of Systems Review of Systems  All other systems reviewed and are negative.    Physical Exam Updated Vital Signs BP 103/70 (BP Location: Left Arm)   Pulse 63   Temp 98.4 F (36.9 C) (Oral)   Resp 18   LMP 09/06/2017 (Exact Date)   SpO2 100%   Physical Exam  Constitutional: She is oriented to person, place, and time. She  appears well-developed and well-nourished. No distress.  HENT:  Head: Normocephalic and atraumatic.  Eyes: Pupils are equal, round, and reactive to light. Conjunctivae and EOM are normal.  Neck: Normal range of motion and phonation normal. Neck supple.  Cardiovascular: Normal rate and regular rhythm.  Pulmonary/Chest: Effort normal and breath sounds normal. She exhibits no tenderness.  Abdominal: Soft. She exhibits no distension. There is no tenderness. There is no guarding.  Musculoskeletal: Normal range of motion.  Neurological: She is alert and oriented to person, place, and time. She exhibits normal muscle tone.  Skin: Skin is warm and dry.  Psychiatric: She has a normal mood and affect. Her behavior is normal. Judgment and thought content normal.  Not responding to internal stimuli.  Cooperative and interactive.  Nursing note and vitals reviewed.    ED Treatments / Results  Labs (all labs ordered are listed, but only abnormal results are displayed) Labs Reviewed  COMPREHENSIVE METABOLIC PANEL - Abnormal; Notable  for the following components:      Result Value   Calcium 8.6 (*)    ALT 13 (*)    All other components within normal limits  ACETAMINOPHEN LEVEL - Abnormal; Notable for the following components:   Acetaminophen (Tylenol), Serum <10 (*)    All other components within normal limits  RAPID URINE DRUG SCREEN, HOSP PERFORMED - Abnormal; Notable for the following components:   Cocaine POSITIVE (*)    Tetrahydrocannabinol POSITIVE (*)    All other components within normal limits  ETHANOL  SALICYLATE LEVEL  CBC  PREGNANCY, URINE    EKG None  Radiology No results found.  Procedures Procedures (including critical care time)  Medications Ordered in ED Medications - No data to display   Initial Impression / Assessment and Plan / ED Course  I have reviewed the triage vital signs and the nursing notes.  Pertinent labs & imaging results that were available during my care of the patient were reviewed by me and considered in my medical decision making (see chart for details).  Clinical Course as of Sep 18 1198  Wed Sep 17, 2017  1030 At this point the patient is medically cleared for evaluation and treatment by TTS/psychiatry   [EW]  1040 Hopelessness associated with polysubstance abuse, and homelessness.  Doubt high risk for suicide.  Will ask patient to be seen by TTS and consider disposition then.   [EW]  1157 Normal  Acetaminophen level(!) [EW]  1157 Normal  Ethanol [EW]  1158 Normal  cbc [EW]  1158 Normal  Pregnancy, urine [EW]  1158 Abnormal, cocaine present, THC present  Rapid urine drug screen (hospital performed)(!) [EW]  1158 Normal  Salicylate level [EW]  1158 Normal except calcium low 8.6  Comprehensive metabolic panel(!) [EW]    Clinical Course User Index [EW] Mancel Bale, MD    Patient Vitals for the past 24 hrs:  BP Temp Temp src Pulse Resp SpO2  09/17/17 1046 103/70 - - 63 - -  09/17/17 1001 (!) 148/118 98.4 F (36.9 C) Oral 62 18 100 %    11:57  AM Reevaluation with update and discussion. After initial assessment and treatment, an updated evaluation reveals no change in clinical status. Mancel Bale   Medical Decision Making: Suicidal ideation with homelessness, and cocaine abuse.  Suspect polysubstance abuse with heroin as well.  CRITICAL CARE-no Performed by: Mancel Bale  Nursing Notes Reviewed/ Care Coordinated Applicable Imaging Reviewed Interpretation of Laboratory Data incorporated into ED treatment   TTS consult-they decided  to admit the patient to the behavioral health Hospital  Plan: Transfer to behavioral health   Final Clinical Impressions(s) / ED Diagnoses   Final diagnoses:  Suicidal thoughts  Cocaine abuse [facility])  Homelessness    ED Discharge Orders    None       Mancel Bale, MD 09/17/17 1200

## 2017-09-17 NOTE — BH Assessment (Signed)
[facility] Assessment Progress Note  Per Juanetta Beets, DO, this pt requires psychiatric hospitalization at this time.  Malva Limes, RN, [facility] has assigned pt to [facility] Rm 304-2.  Pt has signed Voluntary Admission and Consent for Treatment, as well as Consent to Release Information no one, and signed forms have been faxed to [facility].  Pt's nurse has been notified, and agrees to send original paperwork along with pt via [place], and to call report to [phone].  Doylene Canning, [place] [facility] Coordinator [phone]

## 2017-09-17 NOTE — Tx Team (Signed)
Initial Treatment Plan 09/17/2017 11:59 PM Deborah Fischer [id number]    PATIENT STRESSORS: Financial difficulties Legal issue Loss of relationship (b/u with boyfriend) Substance abuse   PATIENT STRENGTHS: Average or above average intelligence Capable of independent living General fund of knowledge Physical Health Supportive family/friends   PATIENT IDENTIFIED PROBLEMS: Polysub abuse  Breakup with boyfriend and move back to [place] from [place] recently  Legal issues with court date of 6/11  Risk for self harm  Presently homeless and unemployed    "I need to detox so I can go to [employer]"  "I need to get my life straight"       DISCHARGE CRITERIA:  Ability to meet basic life and health needs Improved stabilization in mood, thinking, and/or behavior Motivation to continue treatment in a less acute level of care Need for constant or close observation no longer present Verbal commitment to aftercare and medication compliance Withdrawal symptoms are absent or subacute and managed without 24-hour nursing intervention  PRELIMINARY DISCHARGE PLAN: Attend aftercare/continuing care group Attend 12-step recovery group Outpatient therapy Placement in alternative living arrangements  PATIENT/FAMILY INVOLVEMENT: This treatment plan has been presented to and reviewed with the patient, Deborah Fischer, and/or family member.  The patient and family have been given the opportunity to ask questions and make suggestions.  Charlott Holler, RN 09/17/2017, 11:59 PM

## 2017-09-18 DIAGNOSIS — Z818 Family history of other mental and behavioral disorders: Secondary | ICD-10-CM

## 2017-09-18 DIAGNOSIS — R45851 Suicidal ideations: Secondary | ICD-10-CM

## 2017-09-18 DIAGNOSIS — Z811 Family history of alcohol abuse and dependence: Secondary | ICD-10-CM

## 2017-09-18 DIAGNOSIS — F1424 Cocaine dependence with cocaine-induced mood disorder: Secondary | ICD-10-CM

## 2017-09-18 DIAGNOSIS — Z813 Family history of other psychoactive substance abuse and dependence: Secondary | ICD-10-CM

## 2017-09-18 DIAGNOSIS — Z6229 Other upbringing away from parents: Secondary | ICD-10-CM

## 2017-09-18 DIAGNOSIS — F419 Anxiety disorder, unspecified: Secondary | ICD-10-CM

## 2017-09-18 DIAGNOSIS — Z62898 Other specified problems related to upbringing: Secondary | ICD-10-CM

## 2017-09-18 MED ORDER — HYDROXYZINE HCL 25 MG PO TABS
25.0000 mg | ORAL_TABLET | Freq: Four times a day (QID) | ORAL | Status: DC | PRN
  Filled 2017-09-18 (×2): qty 20

## 2017-09-18 MED ORDER — NICOTINE 21 MG/24HR TD PT24
21.0000 mg | MEDICATED_PATCH | Freq: Every day | TRANSDERMAL | Status: DC
Start: 2017-09-18 — End: 2017-09-22
  Administered 2017-09-18 – 2017-09-21 (×4): 21 mg via TRANSDERMAL
  Filled 2017-09-18 (×6): qty 1

## 2017-09-18 MED ORDER — ESCITALOPRAM OXALATE 5 MG PO TABS
5.0000 mg | ORAL_TABLET | Freq: Every day | ORAL | Status: DC
  Administered 2017-09-18 – 2017-09-20 (×3): 5 mg via ORAL
  Filled 2017-09-18 (×5): qty 1

## 2017-09-18 NOTE — Progress Notes (Signed)
D:  Patient reports stable mood; reports withdrawal symptoms as agitation and irritability.  She is pleasant; observed smiling and talking with her peers.  She rates her depression as a 6; hopelessness as a 4; anxiety as a 7.  She denies any thoughts of self harm.  She reports she is sleeping well; her appetite is poor.  She reports low energy level and good concentration.   A: Continue to monitor medication management and MD orders.  Safety checks completed every 15 minutes per protocol.  Offer support and encouragement as needed.  R: Patient is receptive to staff; her behavior is appropriate.
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
## 2017-09-18 NOTE — [facility] Suicide Risk Assessment (Signed)
[facility] INPATIENT:  Family/Significant Other Suicide Prevention Education  Suicide Prevention Education:  Education Completed;Cle Laural Benes (pt's grandmother) [phone] has been identified by the patient as the family member/significant other with whom the patient will be residing, and identified as the person(s) who will aid the patient in the event of a mental health crisis (suicidal ideations/suicide attempt).  With written consent from the patient, the family member/significant other has been provided the following suicide prevention education, prior to the and/or following the discharge of the patient.  The suicide prevention education provided includes the following:  Suicide risk factors  Suicide prevention and interventions  National Suicide Hotline telephone number  [facility] assessment telephone number  [facility] Emergency Assistance 911  [facility] and/or Residential Mobile Crisis Unit telephone number  Request made of family/significant other to:  Remove weapons (e.g., guns, rifles, knives), all items previously/currently identified as safety concern.    Remove drugs/medications (over-the-counter, prescriptions, illicit drugs), all items previously/currently identified as a safety concern.  The family member/significant other verbalizes understanding of the suicide prevention education information provided.  The family member/significant other agrees to remove the items of safety concern listed above.  SPE and aftercare reviewed with pt's grandmother. Pt's grandmother is supportive of pt getting help for her addiction and has no safety concerns regarding pt discharging to [facility] on Monday. She will live with grandmother after completing residential treatment. Pt does not have access to weapons/firearms.   Rona Ravens LCSW 09/18/2017, 10:41 AM

## 2017-09-18 NOTE — Progress Notes (Signed)
Adult Psychoeducational Group Note  Date:  09/18/2017 Time:  8:52 PM  Group Topic/Focus:  Wrap-Up Group:   The focus of this group is to help patients review their daily goal of treatment and discuss progress on daily workbooks.  Participation Level:  Active  Participation Quality:  Appropriate  Affect:  Appropriate  Cognitive:  Alert  Insight: Appropriate  Engagement in Group:  Engaged  Modes of Intervention:  Discussion  Additional Comments:  Pt stated that she has been really irritated today, but she has been able to hold it together.   Kaleen Odea R 09/18/2017, 8:52 PM

## 2017-09-18 NOTE — Progress Notes (Signed)
Nursing Progress Note: 7p-7a D: Pt currently presents with a anxious/pleasant affect and behavior. Pt states "I have always been living for the moment. I am serious now. I want to be clean and stay clean." Interacting appropriately with the milieu. Pt did attend wrap-up group.  A: Pt provided with medications per providers orders. Pt's labs and vitals were monitored throughout the night. Pt supported emotionally and encouraged to express concerns and questions. Pt educated on medications.  R: Pt's safety ensured with 15 minute and environmental checks. Pt currently denies SI, HI, and AVH. Pt verbally contracts to seek staff if SI,HI, or AVH occurs and to consult with staff before acting on any harmful thoughts. Will continue to monitor.
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
## 2017-09-18 NOTE — [facility] Group Notes (Signed)
Adult Psychoeducational Group Note  Date:  09/18/2017 Time:  4:45 PM  Group Topic/Focus:  Crisis Planning:   The purpose of this group is to help patients create a crisis plan for use upon discharge or in the future, as needed.  Participation Level:  Active  Participation Quality:  Appropriate  Affect:  Appropriate  Cognitive:  Appropriate  Insight: Good  Engagement in Group:  Improving  Modes of Intervention:  Clarification  Additional Comments:    Donell Beers 09/18/2017, 4:45 PM

## 2017-09-18 NOTE — [facility] Group Notes (Signed)
LCSW Group Therapy Note  09/18/2017 1:15pm  Type of Therapy/Topic:  Group Therapy:  Feelings about Diagnosis  Participation Level:  Did Not Attend--pt invited. Chose to remain in bed.    Description of Group:   This group will allow patients to explore their thoughts and feelings about diagnoses they have received. Patients will be guided to explore their level of understanding and acceptance of these diagnoses. Facilitator will encourage patients to process their thoughts and feelings about the reactions of others to their diagnosis and will guide patients in identifying ways to discuss their diagnosis with significant others in their lives. This group will be process-oriented, with patients participating in exploration of their own experiences, giving and receiving support, and processing challenge from other group members.   Therapeutic Goals: 1. Patient will demonstrate understanding of diagnosis as evidenced by identifying two or more symptoms of the disorder 2. Patient will be able to express two feelings regarding the diagnosis 3. Patient will demonstrate their ability to communicate their needs through discussion and/or role play  Summary of Patient Progress:   x   Therapeutic Modalities:   Cognitive Behavioral Therapy Brief Therapy Feelings Identification    Devereaux Grayson S Adryana Mogensen, LCSW 09/18/2017 10:58 AM  

## 2017-09-19 DIAGNOSIS — R300 Dysuria: Secondary | ICD-10-CM

## 2017-09-19 DIAGNOSIS — F1410 Cocaine abuse, uncomplicated: Secondary | ICD-10-CM | POA: Diagnosis present

## 2017-09-19 DIAGNOSIS — F314 Bipolar disorder, current episode depressed, severe, without psychotic features: Principal | ICD-10-CM

## 2017-09-19 DIAGNOSIS — F1290 Cannabis use, unspecified, uncomplicated: Secondary | ICD-10-CM

## 2017-09-19 DIAGNOSIS — F17210 Nicotine dependence, cigarettes, uncomplicated: Secondary | ICD-10-CM

## 2017-09-19 DIAGNOSIS — F1010 Alcohol abuse, uncomplicated: Secondary | ICD-10-CM

## 2017-09-19 DIAGNOSIS — G4700 Insomnia, unspecified: Secondary | ICD-10-CM

## 2017-09-19 DIAGNOSIS — F1490 Cocaine use, unspecified, uncomplicated: Secondary | ICD-10-CM

## 2017-09-19 LAB — URINALYSIS, ROUTINE W REFLEX MICROSCOPIC
Bilirubin Urine: NEGATIVE
GLUCOSE, UA: NEGATIVE mg/dL
HGB URINE DIPSTICK: NEGATIVE
KETONES UR: NEGATIVE mg/dL
Leukocytes, UA: NEGATIVE
Nitrite: NEGATIVE
PH: 6 (ref 5.0–8.0)
Protein, ur: NEGATIVE mg/dL
Specific Gravity, Urine: 1.001 — ABNORMAL LOW (ref 1.005–1.030)

## 2017-09-19 LAB — TSH: TSH: 1.098 u[IU]/mL (ref 0.350–4.500)

## 2017-09-19 NOTE — Plan of Care (Signed)
  Problem: Education: Goal: Emotional status will improve Outcome: Progressing Goal: Mental status will improve Outcome: Progressing   

## 2017-09-19 NOTE — Tx Team (Signed)
Interdisciplinary Treatment and Diagnostic Plan Update  09/19/2017 Time of Session: 0830AM Deborah Fischer MRN: [id number]  Principal Diagnosis: Bipolar Disorder  Secondary Diagnoses: Active Problems:   Bipolar affective disorder, depressed, severe (HCC)   Current Medications:  Current Facility-Administered Medications  Medication Dose Route Frequency Provider Last Rate Last Dose  . acetaminophen (TYLENOL) tablet 650 mg  650 mg Oral Q6H PRN Charm RingsLord, Jamison Y, NP      . alum & mag hydroxide-simeth (MAALOX/MYLANTA) 200-200-20 MG/5ML suspension 30 mL  30 mL Oral Q4H PRN Charm RingsLord, Jamison Y, NP      . escitalopram (LEXAPRO) tablet 5 mg  5 mg Oral Daily Cobos, Rockey SituFernando A, MD   5 mg at 09/19/17 0747  . hydrOXYzine (ATARAX/VISTARIL) tablet 25 mg  25 mg Oral Q6H PRN Cobos, Rockey SituFernando A, MD      . magnesium hydroxide (MILK OF MAGNESIA) suspension 30 mL  30 mL Oral Daily PRN Charm RingsLord, Jamison Y, NP      . nicotine (NICODERM CQ - dosed in mg/24 hours) patch 21 mg  21 mg Transdermal Daily Cobos, Rockey SituFernando A, MD   21 mg at 09/19/17 0749  . traZODone (DESYREL) tablet 50 mg  50 mg Oral QHS,MR X 1 Lord, Jamison Y, NP   50 mg at 09/18/17 2120   PTA Medications: Medications Prior to Admission  Medication Sig Dispense Refill Last Dose  . nicotine (NICODERM CQ - DOSED IN MG/24 HOURS) 21 mg/24hr patch Place 1 patch (21 mg total) onto the skin daily. (Patient not taking: Reported on 09/17/2017) 28 patch 0 Unknown at Unknown time  . QUEtiapine (SEROQUEL) 400 MG tablet Take 1 tablet (400 mg total) by mouth at bedtime. 30 tablet 0 Unknown at Unknown time  . traZODone (DESYREL) 50 MG tablet Take 1 tablet (50 mg total) by mouth at bedtime and may repeat dose one time if needed. 30 tablet 0 Unknown at Unknown time    Patient Stressors: Financial difficulties Legal issue Loss of relationship (b/u with boyfriend) Substance abuse  Patient Strengths: Average or above average intelligence Capable of independent  living General fund of knowledge Physical Health Supportive family/friends  Treatment Modalities: Medication Management, Group therapy, Case management,  1 to 1 session with clinician, Psychoeducation, Recreational therapy.   Physician Treatment Plan for Primary Diagnosis: Bipolar Disorder Long Term Goal(s): Improvement in symptoms so as ready for discharge Improvement in symptoms so as ready for discharge   Short Term Goals: Ability to identify triggers associated with substance abuse/mental health issues will improve Ability to identify changes in lifestyle to reduce recurrence of condition will improve Ability to verbalize feelings will improve Ability to disclose and discuss suicidal ideas Ability to demonstrate self-control will improve Ability to identify and develop effective coping behaviors will improve Ability to maintain clinical measurements within normal limits will improve  Medication Management: Evaluate patient's response, side effects, and tolerance of medication regimen.  Therapeutic Interventions: 1 to 1 sessions, Unit Group sessions and Medication administration.  Evaluation of Outcomes: Progressing  Physician Treatment Plan for Secondary Diagnosis: Active Problems:   Bipolar affective disorder, depressed, severe (HCC)  Long Term Goal(s): Improvement in symptoms so as ready for discharge Improvement in symptoms so as ready for discharge   Short Term Goals: Ability to identify triggers associated with substance abuse/mental health issues will improve Ability to identify changes in lifestyle to reduce recurrence of condition will improve Ability to verbalize feelings will improve Ability to disclose and discuss suicidal ideas Ability to demonstrate self-control will improve  Ability to identify and develop effective coping behaviors will improve Ability to maintain clinical measurements within normal limits will improve     Medication Management: Evaluate  patient's response, side effects, and tolerance of medication regimen.  Therapeutic Interventions: 1 to 1 sessions, Unit Group sessions and Medication administration.  Evaluation of Outcomes: Progressing   RN Treatment Plan for Primary Diagnosis:Bipolar Disorder Long Term Goal(s): Knowledge of disease and therapeutic regimen to maintain health will improve  Short Term Goals: Ability to remain free from injury will improve, Ability to participate in decision making will improve and Ability to identify and develop effective coping behaviors will improve  Medication Management: RN will administer medications as ordered by provider, will assess and evaluate patient's response and provide education to patient for prescribed medication. RN will report any adverse and/or side effects to prescribing provider.  Therapeutic Interventions: 1 on 1 counseling sessions, Psychoeducation, Medication administration, Evaluate responses to treatment, Monitor vital signs and CBGs as ordered, Perform/monitor CIWA, COWS, AIMS and Fall Risk screenings as ordered, Perform wound care treatments as ordered.  Evaluation of Outcomes: Progressing   LCSW Treatment Plan for Primary Diagnosis: Bipolar Disorder Long Term Goal(s): Safe transition to appropriate next level of care at discharge, Engage patient in therapeutic group addressing interpersonal concerns.  Short Term Goals: Engage patient in aftercare planning with referrals and resources, Facilitate patient progression through stages of change regarding substance use diagnoses and concerns and Identify triggers associated with mental health/substance abuse issues  Therapeutic Interventions: Assess for all discharge needs, 1 to 1 time with Social worker, Explore available resources and support systems, Assess for adequacy in community support network, Educate family and significant other(s) on suicide prevention, Complete Psychosocial Assessment, Interpersonal group  therapy.  Evaluation of Outcomes: Progressing   Progress in Treatment: Attending groups: Yes. Participating in groups: Yes. Taking medication as prescribed: Yes. Toleration medication: Yes. Family/Significant other contact made: Yes, individual(s) contacted:  pt's grandmother. Patient understands diagnosis: Yes. Discussing patient identified problems/goals with staff: Yes. Medical problems stabilized or resolved: Yes. Denies suicidal/homicidal ideation: Yes. Issues/concerns per patient self-inventory: No. Other: n/a   New problem(s) identified: No, Describe:  n/a  New Short Term/Long Term Goal(s): detox, medication management for mood stabilization; elimination of SI thoughts; development of comprehensive mental wellness/sobriety plan.    Patient Goals:  "learn as many coping skills as I can and prepare for my screening at [facility] on Monday."   Discharge Plan or Barriers: Pt has [facility] screening and possible admission on Monday, 09/22/17. [facility] for outpatient services. MHAG pamphlet, Mobile Crisis information, and AA/NA information provided to patient for additional community support and resources.   Reason for Continuation of Hospitalization: Anxiety Depression Medication stabilization Withdrawal symptoms  Estimated Length of Stay: Monday morning at 7am--pt will discharge directly to [facility].   Attendees: Patient: Deborah Fischer 09/19/2017 8:44 AM  Physician: Dr. Jama Flavors MD; Dr. Altamese [place] MD 09/19/2017 8:44 AM  Nursing: Erskine Squibb RN; Rayfield Citizen RN 09/19/2017 8:44 AM  RN Care Manager:x 09/19/2017 8:44 AM  Social Worker: Corrie Mckusick LCSW 09/19/2017 8:44 AM  Recreational Therapist: x 09/19/2017 8:44 AM  Other: Reola Calkins NP; Armandina Stammer NP 09/19/2017 8:44 AM  Other:  09/19/2017 8:44 AM  Other: 09/19/2017 8:44 AM    Scribe for Treatment Team: Rona Ravens, LCSW 09/19/2017 8:44 AM

## 2017-09-19 NOTE — Progress Notes (Addendum)
[facility] MD Progress Note  09/19/2017 10:57 AM Deborah Fischer  MRN:  [id number]   Subjective:  Patient reports that she is feeling good today. She denies any SI/HI/AVH and contracts for safety. Patient states that she has been sleeping and eating well. She denies any medication side effects. Patient does report urinary frequency and burning during urination.   Objective: Patient's chart and findings reviewed and discussed with treatment team. Patient presents in the day room and has been interacting with peers and staff appropriately. She has been attending group and participating. Due to patieint's complaint of dysuria and frequency will order urinalysis and will decide on antibiotic after results return. She does not report a strong odor. She does report unprotected sex about 2 weeks ago.    Principal Problem: Bipolar affective disorder, depressed, severe (HCC) Diagnosis:   Patient Active Problem List   Diagnosis Date Noted  . Bipolar affective disorder, depressed, severe (HCC) [F31.4] 09/17/2017  . Bipolar disorder (HCC) [F31.9] 01/14/2017  . Alcohol use disorder, moderate, dependence (HCC) [F10.20] 01/14/2017   Total Time spent with patient: 20 minutes  Past Psychiatric History: See H&P  Past Medical History:  Past Medical History:  Diagnosis Date  . Manic depression (HCC)     Past Surgical History:  Procedure Laterality Date  . WISDOM TOOTH EXTRACTION     Family History: History reviewed. No pertinent family history. Family Psychiatric  History: See H&P Social History:  Social History   Substance and Sexual Activity  Alcohol Use Yes  . Alcohol/week: 12.0 oz  . Types: 20 Cans of beer per week   Comment: Daily. Beer: 3-4 bottles of beer. Liquor 1/2 5th (2-3 times a week) Last drink: 2 hours ago,.      Social History   Substance and Sexual Activity  Drug Use Yes  . Frequency: 7.0 times per week  . Types: "Crack" cocaine, Cocaine, Marijuana   Comment: +THC and + COC     Social History   Socioeconomic History  . Marital status: Single    Spouse name: Not on file  . Number of children: Not on file  . Years of education: Not on file  . Highest education level: Not on file  Occupational History  . Not on file  Social Needs  . Financial resource strain: Not on file  . Food insecurity:    Worry: Not on file    Inability: Not on file  . Transportation needs:    Medical: Not on file    Non-medical: Not on file  Tobacco Use  . Smoking status: Current Every Day Smoker    Packs/day: 0.10    Types: Cigarettes  . Smokeless tobacco: Never Used  Substance and Sexual Activity  . Alcohol use: Yes    Alcohol/week: 12.0 oz    Types: 20 Cans of beer per week    Comment: Daily. Beer: 3-4 bottles of beer. Liquor 1/2 5th (2-3 times a week) Last drink: 2 hours ago,.   . Drug use: Yes    Frequency: 7.0 times per week    Types: "Crack" cocaine, Cocaine, Marijuana    Comment: +THC and + COC  . Sexual activity: Not Currently  Lifestyle  . Physical activity:    Days per week: Not on file    Minutes per session: Not on file  . Stress: Not on file  Relationships  . Social connections:    Talks on phone: Not on file    Gets together: Not on file  Attends religious service: Not on file    Active member of club or organization: Not on file    Attends meetings of clubs or organizations: Not on file    Relationship status: Not on file  Other Topics Concern  . Not on file  Social History Narrative  . Not on file   Additional Social History:    Pain Medications: pt denied Prescriptions: pt denied Over the Counter: pt denied History of alcohol / drug use?: Yes Longest period of sobriety (when/how long): unknown Negative Consequences of Use: Financial, Legal, Personal relationships Withdrawal Symptoms: Agitation, Nausea / Vomiting Name of Substance 1: Cocaine 1 - Age of First Use: 20 1 - Amount (size/oz): varied 1 - Frequency: daily 1 - Duration:  ongoing 1 - Last Use / Amount: 09/16/17 Name of Substance 2: Marijuana 2 - Age of First Use: 15 2 - Amount (size/oz): 1-2 grams 2 - Frequency: daily 2 - Duration: ongoing 2 - Last Use / Amount: 09/16/17 Name of Substance 3: crack              Sleep: Good  Appetite:  Good  Current Medications: Current Facility-Administered Medications  Medication Dose Route Frequency Provider Last Rate Last Dose  . acetaminophen (TYLENOL) tablet 650 mg  650 mg Oral Q6H PRN Charm Rings, NP      . alum & mag hydroxide-simeth (MAALOX/MYLANTA) 200-200-20 MG/5ML suspension 30 mL  30 mL Oral Q4H PRN Charm Rings, NP      . escitalopram (LEXAPRO) tablet 5 mg  5 mg Oral Daily Jakhiya Brower, Rockey Situ, MD   5 mg at 09/19/17 0747  . hydrOXYzine (ATARAX/VISTARIL) tablet 25 mg  25 mg Oral Q6H PRN Imaan Padgett, Rockey Situ, MD      . magnesium hydroxide (MILK OF MAGNESIA) suspension 30 mL  30 mL Oral Daily PRN Charm Rings, NP      . nicotine (NICODERM CQ - dosed in mg/24 hours) patch 21 mg  21 mg Transdermal Daily Taavi Hoose, Rockey Situ, MD   21 mg at 09/19/17 0749  . traZODone (DESYREL) tablet 50 mg  50 mg Oral QHS,MR X 1 Lord, Jamison Y, NP   50 mg at 09/18/17 2120    Lab Results:  Results for orders placed or performed during the hospital encounter of 09/17/17 (from the past 48 hour(s))  TSH     Status: None   Collection Time: 09/19/17  7:44 AM  Result Value Ref Range   TSH 1.098 0.350 - 4.500 uIU/mL    Comment: Performed by a 3rd Generation assay with a functional sensitivity of <=0.01 uIU/mL. Performed at [facility], [address], [place], [place] [address]     Blood Alcohol level:  Lab Results  Component Value Date   ETH <10 09/17/2017   ETH <5 01/13/2017    Metabolic Disorder Labs: No results found for: HGBA1C, MPG No results found for: PROLACTIN No results found for: CHOL, TRIG, HDL, CHOLHDL, VLDL, LDLCALC  Physical Findings: AIMS: Facial and Oral Movements Muscles of  Facial Expression: None, normal Lips and Perioral Area: None, normal Jaw: None, normal Tongue: None, normal,Extremity Movements Upper (arms, wrists, hands, fingers): None, normal Lower (legs, knees, ankles, toes): None, normal, Trunk Movements Neck, shoulders, hips: None, normal, Overall Severity Severity of abnormal movements (highest score from questions above): None, normal Incapacitation due to abnormal movements: None, normal Patient's awareness of abnormal movements (rate only patient's report): No Awareness, Dental Status Current problems with teeth and/or dentures?:  No Does patient usually wear dentures?: No  CIWA:  CIWA-Ar Total: 0 COWS:  COWS Total Score: 0  Musculoskeletal: Strength & Muscle Tone: within normal limits Gait & Station: normal Patient leans: N/A  Psychiatric Specialty Exam: Physical Exam  Nursing note and vitals reviewed. Constitutional: She is oriented to person, place, and time. She appears well-developed and well-nourished.  Cardiovascular: Normal rate.  Respiratory: Effort normal.  Musculoskeletal: Normal range of motion.  Neurological: She is alert and oriented to person, place, and time.  Skin: Skin is warm.    Review of Systems  Constitutional: Negative.   HENT: Negative.   Eyes: Negative.   Respiratory: Negative.   Cardiovascular: Negative.   Gastrointestinal: Negative.   Genitourinary: Positive for dysuria and frequency.  Musculoskeletal: Negative.   Skin: Negative.   Neurological: Negative.   Endo/Heme/Allergies: Negative.   Psychiatric/Behavioral: Positive for depression. Negative for hallucinations and suicidal ideas. The patient is not nervous/anxious and does not have insomnia.     Blood pressure 108/66, pulse 69, temperature 98.6 F (37 C), temperature source Oral, resp. rate 18, height 5' 5.5" (1.664 m), weight 59 kg (130 lb), last menstrual period 09/06/2017, SpO2 100 %.Body mass index is 21.3 kg/m.  General Appearance: Casual   Eye Contact:  Good  Speech:  Clear and Coherent and Normal Rate  Volume:  Normal  Mood:  Euthymic but reports mild depression  Affect:  Appropriate  Thought Process:  Goal Directed and Descriptions of Associations: Intact  Orientation:  Full (Time, Place, and Person)  Thought Content:  WDL  Suicidal Thoughts:  No  Homicidal Thoughts:  No  Memory:  Immediate;   Good Recent;   Good Remote;   Good  Judgement:  Fair  Insight:  Fair  Psychomotor Activity:  Normal  Concentration:  Concentration: Good and Attention Span: Good  Recall:  Good  Fund of Knowledge:  Good  Language:  Good  Akathisia:  No  Handed:  Right  AIMS (if indicated):     Assets:  Communication Skills Desire for Improvement Financial Resources/Insurance Housing Physical Health Social Support Transportation  ADL's:  Intact  Cognition:  WNL  Sleep:  Number of Hours: 5.5   Problems Addressed: Bipolar affective disorder, depressed severe Cocaine abuse  Treatment Plan Summary: Daily contact with patient to assess and evaluate symptoms and progress in treatment, Medication management and Plan is to:  -Continue Lexapro 5 mg PO Daily for mood stability -Continue Vistaril 25 mg PO Q6H PRN for anxiety -Continue Trazodone 50 mg PO QHS PRN for insomnia -Encourage group therapy participation -Urinalysis ordered for reported dysuria and frequency  Maryfrances Bunnell, FNP 09/19/2017, 10:57 AM   ..Marland KitchenAgree with NP Progress Note

## 2017-09-19 NOTE — Progress Notes (Signed)
Recreation Therapy Notes  Date: 5.31.19 Time: 0930 Location: [address] Dayroom  Group Topic: Stress Management  Goal Area(s) Addresses:  Patient will verbalize importance of using healthy stress management.  Patient will identify positive emotions associated with healthy stress management.   Intervention: Stress Management  Activity :  Progressive Muscle Relaxation.  LRT lead group in progressive muscle relaxation.  Patients were to tense each muscle one at Fischer time and then release the tension.  Patients were to follow along as LRT lead them through the activity.    Education:  Stress Management, Discharge Planning.   Education Outcome: Acknowledges edcuation/In group clarification offered/Needs additional education  Clinical Observations/Feedback:  Pt did not attend group.      Elena Davia, LRT/CTRS         Deborah Fischer 09/19/2017 11:28 AM 

## 2017-09-19 NOTE — [facility] Group Notes (Signed)
[facility] Group Notes:  (Nursing/MHT/Case Management/Adjunct)  Date:  09/19/2017  Time:  5:31 PM  Type of Therapy:  Psychoeducational Skills  Participation Level:  Did Not Attend  Participation Quality:  DID NOT ATTEND  Affect:  DID NOT ATTEND  Cognitive:  DID NOT ATTEND  Insight:  None  Engagement in Group:  DID NOT ATTEND  Modes of Intervention:  DID NOT ATTEND  Summary of Progress/Problems: Pt did not attend patient self inventory group.     Carlene CoriaJane O Azra Abrell 09/19/2017, 5:31 PM

## 2017-09-19 NOTE — Progress Notes (Signed)
D: Patient's mood has been stable.  She complains of some UTI symptoms and a urine culture is pending.  Patient has been isolative to room today, however, she is going to meals.  She denies any thoughts of self harm.  Patient has a screening at [facility] on Monday and must discharged by 0700.  Patient will need 14 days of medication samples.    A: Continue to monitor medication management and MD orders.  Safety checks continued every 15 minutes per protocol.  Offer support and encouragement as needed.  R: Patient is receptive to staff; her behavior is appropriate.

## 2017-09-19 NOTE — Progress Notes (Signed)
  [facility] Adult Case Management Discharge Plan :  Will you be returning to the same living situation after discharge:  No.Pt has screening and possible admission at [facility] on Monday.  At discharge, do you have transportation home?: Yes,  taxi voucher in chart. PATIENT MUST DISCHARGE BY 7:00AM ON MONDAY 6/3 FOR [facility] SCREENING AT 8AM.  Do you have the ability to pay for your medications: Yes,  mental health  Release of information consent forms completed and submitted to medical records by CSW.  Patient to Follow up at: Follow-up Information    [facility] Follow up.   Why:  You have screening for possible admission on Monday, 09/22/17 at 8:00AM. Please bring: Photo ID/proof of [place] residency, medications/prescriptions provided by hospital, and clothing (no leggings). Thank you.  Contact information: [address] [place] [address] [phone]        [facility] Follow up.   Specialty:  Behavioral Health Why:  Please walk in within 3 days of hospital/rehab discharge to be assessed for outpatient mental health services including medication management and therapy. Walk in hours: Monday-Friday 8am-10am. Thank you.  Contact information: [address] [place] [address] [phone]           Next level of care provider has access to [facility] Link:no  Safety Planning and Suicide Prevention discussed: Yes,  SPE completed with pt's grandmother. SPI pamphlet and mobile crisis information also provided to pt.   Have you used any form of tobacco in the last 30 days? (Cigarettes, Smokeless Tobacco, Cigars, and/or Pipes): Yes  Has patient been referred to the Quitline?: Patient refused referral  Patient has been referred for addiction treatment: Yes  Rona Ravens, LCSW 09/19/2017, 2:27 PM

## 2017-09-19 NOTE — [facility] Group Notes (Signed)
LCSW Group Therapy Note 09/19/2017 2:06 PM  Type of Therapy and Topic: Group Therapy: Avoiding Self-Sabotaging and Enabling Behaviors  Participation Level: Active  Description of Group:  In this group, patients will learn how to identify obstacles, self-sabotaging and enabling behaviors, as well as: what are they, why do we do them and what needs these behaviors meet. Discuss unhealthy relationships and how to have positive healthy boundaries with those that sabotage and enable. Explore aspects of self-sabotage and enabling in yourself and how to limit these self-destructive behaviors in everyday life.  Therapeutic Goals: 1. Patient will identify one obstacle that relates to self-sabotage and enabling behaviors 2. Patient will identify one personal self-sabotaging or enabling behavior they did prior to admission 3. Patient will state a plan to change the above identified behavior 4. Patient will demonstrate ability to communicate their needs through discussion and/or role play.   Summary of Patient Progress:  Deborah Fischer was engaged and participated throughout the group session. Deborah Fischer reports that her denial of her addiction and her procrastination to seek help for it are her main self sabotaging behaviors. Deborah Fischer states that once she discharges she plans to go to [facility] for residential treatment, in hopes to build upon her coping skills and road to recovery.    Therapeutic Modalities:  Cognitive Behavioral Therapy Person-Centered Therapy Motivational Interviewing   Baldo Daub LCSWA Clinical Social Worker

## 2017-09-20 MED ORDER — TRAZODONE HCL 100 MG PO TABS
100.0000 mg | ORAL_TABLET | Freq: Every evening | ORAL | Status: DC | PRN
  Administered 2017-09-20 – 2017-09-21 (×2): 100 mg via ORAL
  Filled 2017-09-20 (×2): qty 1
  Filled 2017-09-20: qty 14

## 2017-09-20 MED ORDER — ESCITALOPRAM OXALATE 10 MG PO TABS
10.0000 mg | ORAL_TABLET | Freq: Every day | ORAL | Status: DC
  Administered 2017-09-21: 10 mg via ORAL
  Filled 2017-09-20 (×3): qty 1
  Filled 2017-09-20: qty 14

## 2017-09-20 NOTE — Progress Notes (Signed)
D: Patient presents calm and cooperative with assessment. Minimal participation in plan of care, isolating to room, not attending group therapy sessions. Patient reports she had trouble falling asleep, and did not get to sleep until this morning. Denies SI/HI/AVH. Patient denies feeling depressed or having any symptoms of withdrawal. A: Patient encouraged to participate in group therapy sessions, attend recreation therapy, and go to meals with peers. Medications reviewed and side effects monitored. Fluids offered and given. R: Goal for today "to get out of the bed today." Patient is medication compliant. Contracts for safety.

## 2017-09-20 NOTE — Progress Notes (Signed)
Patient ID: Riesa PopeJocelynn Karczewski, female   DOB: Mar 31, 1996, 22 y.o.   MRN: [id number]  D: Patient is pleasant on approach tonight. Reports had a visit with grandmother and it went better than expected. She reports mood good tonight with no active SI at present. Interacting well with other peers in dayroom.   A: Staff will monitor on q 15 minute checks, follow treatment plan, and give meds as ordered. R: Cooperative on the unit

## 2017-09-20 NOTE — [facility] Group Notes (Signed)
Recreation Activity  Date:  09/20/2017  Time:  5:56 PM  Type of Therapy:  BINGO : The purpose of the group is to create a forum where patients can experience laughter in a [employer] game.  Participation Level:  Did Not Attend  Participation Quality:    Affect:    Cognitive:    Insight:    Engagement in Group:    Modes of Intervention:    Summary of Progress/Problems:  Deborah BraveDuke, Deborah Fischer Lynn 09/20/2017, 5:56 PM

## 2017-09-20 NOTE — [facility] Group Notes (Signed)
[facility] Group Notes: (Clinical Social Work)   09/20/2017      Type of Therapy:  Group Therapy   Participation Level:  Did Not Attend despite MHT prompting   Franz Svec Grossman-Orr, LCSW 09/20/2017, 11:13 AM     

## 2017-09-20 NOTE — Progress Notes (Signed)
[facility] MD Progress Note  09/20/2017 11:13 AM Deborah Fischer  MRN:  [id number]   Subjective:  States she is feeling partially better than prior to admission. Denies suicidal ideations.  Denies medication side effects. Reports her sleep has been fair.  Objective:  I have reviewed chart notes and have met with patient. 22 year old female, history of Cocaine Abuse, had recently relocated to [facility] from [place].  On admission reported depression, suicidal ideations.  At this time reports partially improved mood, but still presents vaguely dysphoric , constricted in affect. Denies suicidal ideations at this time, contracts for safety. She has been visible on unit and has been going to some groups, behavior on unit in good control. Currently on Lexapro trial, no side effects thus far . TSH WNL. UA unremarkable.       Principal Problem: Bipolar affective disorder, depressed, severe ([place]) Diagnosis:   Patient Active Problem List   Diagnosis Date Noted  . Cocaine abuse ([place]) [F14.10] 09/19/2017  . Bipolar affective disorder, depressed, severe ([place]) [F31.4] 09/17/2017  . Bipolar disorder ([place]) [F31.9] 01/14/2017  . Alcohol use disorder, moderate, dependence ([place]) [F10.20] 01/14/2017   Total Time spent with patient: 20 minutes  Past Psychiatric History: See H&P  Past Medical History:  Past Medical History:  Diagnosis Date  . Manic depression ([place])     Past Surgical History:  Procedure Laterality Date  . WISDOM TOOTH EXTRACTION     Family History: History reviewed. No pertinent family history. Family Psychiatric  History: See H&P Social History:  Social History   Substance and Sexual Activity  Alcohol Use Yes  . Alcohol/week: 12.0 oz  . Types: 20 Cans of beer per week   Comment: Daily. Beer: 3-4 bottles of beer. Liquor 1/2 5th (2-3 times a week) Last drink: 2 hours ago,.      Social History   Substance and Sexual Activity  Drug Use Yes  . Frequency: 7.0 times per week  .  Types: "Crack" cocaine, Cocaine, Marijuana   Comment: +THC and + COC    Social History   Socioeconomic History  . Marital status: Single    Spouse name: Not on file  . Number of children: Not on file  . Years of education: Not on file  . Highest education level: Not on file  Occupational History  . Not on file  Social Needs  . Financial resource strain: Not on file  . Food insecurity:    Worry: Not on file    Inability: Not on file  . Transportation needs:    Medical: Not on file    Non-medical: Not on file  Tobacco Use  . Smoking status: Current Every Day Smoker    Packs/day: 0.10    Types: Cigarettes  . Smokeless tobacco: Never Used  Substance and Sexual Activity  . Alcohol use: Yes    Alcohol/week: 12.0 oz    Types: 20 Cans of beer per week    Comment: Daily. Beer: 3-4 bottles of beer. Liquor 1/2 5th (2-3 times a week) Last drink: 2 hours ago,.   . Drug use: Yes    Frequency: 7.0 times per week    Types: "Crack" cocaine, Cocaine, Marijuana    Comment: +THC and + COC  . Sexual activity: Not Currently  Lifestyle  . Physical activity:    Days per week: Not on file    Minutes per session: Not on file  . Stress: Not on file  Relationships  . Social connections:  Talks on phone: Not on file    Gets together: Not on file    Attends religious service: Not on file    Active member of club or organization: Not on file    Attends meetings of clubs or organizations: Not on file    Relationship status: Not on file  Other Topics Concern  . Not on file  Social History Narrative  . Not on file   Additional Social History:    Pain Medications: pt denied Prescriptions: pt denied Over the Counter: pt denied History of alcohol / drug use?: Yes Longest period of sobriety (when/how long): unknown Negative Consequences of Use: Financial, Legal, Personal relationships Withdrawal Symptoms: Agitation, Nausea / Vomiting Name of Substance 1: Cocaine 1 - Age of First Use:  20 1 - Amount (size/oz): varied 1 - Frequency: daily 1 - Duration: ongoing 1 - Last Use / Amount: 09/16/17 Name of Substance 2: Marijuana 2 - Age of First Use: 15 2 - Amount (size/oz): 1-2 grams 2 - Frequency: daily 2 - Duration: ongoing 2 - Last Use / Amount: 09/16/17 Name of Substance 3: crack  Sleep: Fair  Appetite:  Good  Current Medications: Current Facility-Administered Medications  Medication Dose Route Frequency Provider Last Rate Last Dose  . acetaminophen (TYLENOL) tablet 650 mg  650 mg Oral Q6H PRN Patrecia Pour, NP      . alum & mag hydroxide-simeth (MAALOX/MYLANTA) 200-200-20 MG/5ML suspension 30 mL  30 mL Oral Q4H PRN Patrecia Pour, NP      . escitalopram (LEXAPRO) tablet 5 mg  5 mg Oral Daily Victory Strollo, Myer Peer, MD   5 mg at 09/20/17 4196  . hydrOXYzine (ATARAX/VISTARIL) tablet 25 mg  25 mg Oral Q6H PRN Sarajane Fambrough, Myer Peer, MD      . magnesium hydroxide (MILK OF MAGNESIA) suspension 30 mL  30 mL Oral Daily PRN Patrecia Pour, NP      . nicotine (NICODERM CQ - dosed in mg/24 hours) patch 21 mg  21 mg Transdermal Daily Lenix Benoist, Myer Peer, MD   21 mg at 09/19/17 0749  . traZODone (DESYREL) tablet 50 mg  50 mg Oral QHS,MR X 1 Lord, Jamison Y, NP   50 mg at 09/19/17 2155    Lab Results:  Results for orders placed or performed during the hospital encounter of 09/17/17 (from the past 48 hour(s))  TSH     Status: None   Collection Time: 09/19/17  7:44 AM  Result Value Ref Range   TSH 1.098 0.350 - 4.500 uIU/mL    Comment: Performed by a 3rd Generation assay with a functional sensitivity of <=0.01 uIU/mL. Performed at [facility], [place] [address], [place], [place] [address]   Urinalysis, Routine w reflex microscopic     Status: Abnormal   Collection Time: 09/19/17  2:49 PM  Result Value Ref Range   Color, Urine COLORLESS (A) YELLOW   APPearance CLEAR CLEAR   Specific Gravity, Urine 1.001 (L) 1.005 - 1.030   pH 6.0 5.0 - 8.0   Glucose, UA NEGATIVE  NEGATIVE mg/dL   Hgb urine dipstick NEGATIVE NEGATIVE   Bilirubin Urine NEGATIVE NEGATIVE   Ketones, ur NEGATIVE NEGATIVE mg/dL   Protein, ur NEGATIVE NEGATIVE mg/dL   Nitrite NEGATIVE NEGATIVE   Leukocytes, UA NEGATIVE NEGATIVE    Comment: Performed at [place] [address] [place], [place] [address]    Blood Alcohol level:  Lab Results  Component Value Date   ETH <  10 09/17/2017   ETH <5 16/01/9603    Metabolic Disorder Labs: No results found for: HGBA1C, MPG No results found for: PROLACTIN No results found for: CHOL, TRIG, HDL, CHOLHDL, VLDL, LDLCALC  Physical Findings: AIMS: Facial and Oral Movements Muscles of Facial Expression: None, normal Lips and Perioral Area: None, normal Jaw: None, normal Tongue: None, normal,Extremity Movements Upper (arms, wrists, hands, fingers): None, normal Lower (legs, knees, ankles, toes): None, normal, Trunk Movements Neck, shoulders, hips: None, normal, Overall Severity Severity of abnormal movements (highest score from questions above): None, normal Incapacitation due to abnormal movements: None, normal Patient's awareness of abnormal movements (rate only patient's report): No Awareness, Dental Status Current problems with teeth and/or dentures?: No Does patient usually wear dentures?: No  CIWA:  CIWA-Ar Total: 0 COWS:  COWS Total Score: 0  Musculoskeletal: Strength & Muscle Tone: within normal limits Gait & Station: normal Patient leans: N/A  Psychiatric Specialty Exam: Physical Exam  Nursing note and vitals reviewed. Constitutional: She is oriented to person, place, and time. She appears well-developed and well-nourished.  Cardiovascular: Normal rate.  Respiratory: Effort normal.  Musculoskeletal: Normal range of motion.  Neurological: She is alert and oriented to person, place, and time.  Skin: Skin is warm.    Review of Systems  Constitutional: Negative.   HENT: Negative.   Eyes: Negative.    Respiratory: Negative.   Cardiovascular: Negative.   Gastrointestinal: Negative.   Genitourinary: Positive for dysuria and frequency.  Musculoskeletal: Negative.   Skin: Negative.   Neurological: Negative.   Endo/Heme/Allergies: Negative.   Psychiatric/Behavioral: Positive for depression. Negative for hallucinations and suicidal ideas. The patient is not nervous/anxious and does not have insomnia.   no fever, no chills, no no chest pain, no shortness of breath, (+) nausea, no vomiting, no diarrhea  Blood pressure 108/66, pulse 69, temperature 98.6 F (37 C), temperature source Oral, resp. rate 18, height 5' 5.5" (1.664 m), weight 59 kg (130 lb), last menstrual period 09/06/2017, SpO2 100 %.Body mass index is 21.3 kg/m.  General Appearance: Fairly Groomed  Eye Contact:  Good  Speech:  Normal Rate  Volume:  Normal  Mood:  partially improved, but still feeling vaguely depressed, dysphoric   Affect:  appropriate, vaguely constricted, but improves partially during session  Thought Process:  Goal Directed and Descriptions of Associations: Intact  Orientation:  Full (Time, Place, and Person)  Thought Content:  Denies hallucinations, no delusions, not internally preoccupied   Suicidal Thoughts:  No denies suicidal or self injurious ideations, denies homicidal or violent ideations  Homicidal Thoughts:  No  Memory:  recent and remote grossly intact   Judgement:  Fair- improving   Insight:  Fair- improving   Psychomotor Activity:  Normal  Concentration:  Concentration: Good and Attention Span: Good  Recall:  Good  Fund of Knowledge:  Good  Language:  Good  Akathisia:  No  Handed:  Right  AIMS (if indicated):     Assets:  Communication Skills Desire for Improvement Financial Resources/Insurance Housing Physical Health Social Support Transportation  ADL's:  Intact  Cognition:  WNL  Sleep:  Number of Hours: 6.75   Assessment - patient reports partially improved mood, but remains  vaguely dysphoric, constricted. No suicidal ideations. Thus far tolerating Lexapro well, except for some nausea . No vomiting. Sleep fair . Endorses feeling motivated in sobriety and wants to go to a rehab at discharge .  Treatment Plan Summary: Daily contact with patient to assess and evaluate symptoms and progress in  treatment, Medication management and Plan is to:  -Treatment plan reviewed as below today 6/1  -Increase  Lexapro to 10 mgrs QDAY  for depression -Continue Vistaril 25 mg Q6H PRN for anxiety -Continue Trazodone 100 mg QHS PRN for insomnia -Encourage group therapy participation to work on Radiographer, therapeutic and symptom reduction -Encourage ongoing efforts to work on Radiographer, therapeutic and symptom reduction -Treatment team working on disposition planning     Jenne Campus, MD 09/20/2017, 11:13 AM   Patient ID: Warnell Forester, female   DOB: September 26, 1995, 22 y.o.   MRN: [id number]

## 2017-09-20 NOTE — Progress Notes (Signed)
Pt did not attend goals/ orientation group this morning. However, pt said that her goal is to get out of the bed today

## 2017-09-20 NOTE — [facility] Group Notes (Signed)
Identifying Needs   Date:  09/20/2017  Time:  3:14 PM  Type of Therapy:  Nurse Education  /  The group focuses on teaching patients how to identify their needs and then how to develop the healthy coping skills needed to get those needs met.  Participation Level:  Did Not Attend    Participation Quality:    Affect:    Cognitive:    Insight:    Engagement in Group:    Modes of Intervention:    Summary of Progress/Problems:  Deborah Fischer 09/20/2017, 3:14 PM

## 2017-09-20 NOTE — Progress Notes (Signed)
Patient did attend the evening speaker AA meeting.  

## 2017-09-20 NOTE — Plan of Care (Signed)
Patient shows improved mood from admission, however, she participates minimally in plan of care. Not attending group therapy sessions or recreation time with peers. Isolating to room.

## 2017-09-21 DIAGNOSIS — F1424 Cocaine dependence with cocaine-induced mood disorder: Secondary | ICD-10-CM

## 2017-09-21 MED ORDER — HYDROXYZINE HCL 25 MG PO TABS: 25.0000 mg | ORAL_TABLET | Freq: Four times a day (QID) | ORAL | 0 refills | Status: DC | PRN

## 2017-09-21 MED ORDER — ESCITALOPRAM OXALATE 10 MG PO TABS: 10.0000 mg | ORAL_TABLET | Freq: Every day | ORAL | 0 refills | Status: DC

## 2017-09-21 MED ORDER — TRAZODONE HCL 100 MG PO TABS: 100.0000 mg | ORAL_TABLET | Freq: Every evening | ORAL | 0 refills | Status: DC | PRN

## 2017-09-21 NOTE — Progress Notes (Signed)
Patient ID: Deborah Fischer, female   DOB: 1995/06/04, 22 y.o.   MRN: [id number]    Pt currently presents with an appropriate affect and behavior. Reports ongoing symptoms of withdrawal including anxiety, malaise, decreased appetite and nausea. Pt states goal is to "take my medications." Reports she is hopeful that her Lexapro will work for her.  Pt provided with medications per providers orders. Pt's labs and vitals were monitored throughout the night. Pt given a 1:1 about emotional and mental status. Pt supported and encouraged to express concerns and questions. Pt educated on medications.  Pt's safety ensured with 15 minute and environmental checks. Pt currently denies SI/HI and A/V hallucinations. Pt verbally agrees to seek staff if SI/HI or A/VH occurs and to consult with staff before acting on any harmful thoughts. Will continue POC.

## 2017-09-21 NOTE — Progress Notes (Signed)
Pt did not attend goals and orientation group this morning. Pt has been sleep most of the morning  

## 2017-09-21 NOTE — B[facility] Group Notes (Signed)
[facility] Group Notes: (Clinical Social Work)   09/21/2017      Type of Therapy:  Group Therapy   Participation Level:  Did Not Attend despite MHT prompting   Deborah Galluzzo Grossman-Orr, LCSW 09/21/2017, 12:31 PM     

## 2017-09-21 NOTE — [facility] Suicide Risk Assessment (Addendum)
[facility]   Principal Problem: Bipolar affective disorder, depressed, severe ([facility]) Discharge Diagnoses:  Patient Active Problem List   Diagnosis Date Noted  . Cocaine abuse ([facility]) [F14.10] 09/19/2017  . Bipolar affective disorder, depressed, severe ([facility]) [F31.4] 09/17/2017  . Bipolar disorder ([facility]) [F31.9] 01/14/2017  . Alcohol use disorder, moderate, dependence ([facility]) [F10.20] 01/14/2017    Total Time spent with patient: 30 minutes  Musculoskeletal: Strength & Muscle Tone: within normal limits Gait & Station: normal Patient leans: N/A  Psychiatric Specialty Exam: ROS no headache, no chest pain, no shortness of breath, no vomiting, no nausea, earlier on admission had reported some UTI symptoms but at this time denies  dysuria, no urgency. No fever, no chills .  Blood pressure 108/66, pulse 69, temperature 98.6 F (37 C), temperature source Oral, resp. rate 18, height 5' 5.5" (1.664 m), weight 59 kg (130 lb), last menstrual period 09/06/2017, SpO2 100 %.Body mass index is 21.3 kg/m.  General Appearance: Well Groomed  Eye Contact::  Good  Speech:  Normal Rate409  Volume:  Normal  Mood:  reports improved mood  Affect:  more reactive   Thought Process:  Linear and Descriptions of Associations: Intact  Orientation:  Full (Time, Place, and Person)  Thought Content:  no hallucinations, no delusions, not internally preoccupied   Suicidal Thoughts:  No denies any suicidal or self injurious ideations, denies any homicidal or violent ideations  Homicidal Thoughts:  No  Memory:  recent and remote grossly intact   Judgement:  Other:  improving   Insight:  improving  Psychomotor Activity:  Normal  Concentration:  Good  Recall:  Good  Fund of Knowledge:Good  Language: Good  Akathisia:  Negative  Handed:  Right  AIMS (if indicated):     Assets:  Communication Skills Desire for Improvement Resilience  Sleep:  Number of Hours: 6.25  Cognition: WNL  ADL's:   Intact   Mental Status Per Nursing Assessment::   On Admission:  NA  Demographic Factors:  22 year old female , single, no children, had recently relocated from [place]. Plans to live with her grandmother after her discharge from rehab, and states she plans to eventually return to [place]  Loss Factors: Relapse, cocaine use disorder  Historical Factors: History of depression, states she has been diagnosed with bipolar disorder in the past,but has reported mood elevation may be related to substance abuse ,  history of substance use disorder ( describes cocaine is substance of choice )  Risk Reduction Factors:   Sense of responsibility to family and Positive coping skills or problem solving skills  Continued Clinical Symptoms:  At this time patient is alert, attentive, calm, reports  improved mood, affect appropriate, reactive, no thought disorder, no suicidal or self injurious ideations, no homicidal or violent ideations,  no hallucinations,no delusions, future oriented .  Behavior on unit in good control, presents calm, pleasant. Denies medication side effects. ( Currently on Lexapro trial) We reviewed medication side effects including risk of increased suicidal ideations early in treatment with antidepressants in young adults .    Cognitive Features That Contribute To Risk:  No gross cognitive deficits noted upon discharge. Is alert , attentive, and oriented x 3   Suicide Risk:  Mild:  Suicidal ideation of limited frequency, intensity, duration, and specificity.  There are no identifiable plans, no associated intent, mild dysphoria and related symptoms, good self-control (both objective and subjective assessment), few other risk factors, and identifiable protective factors, including  available and accessible social support.  Follow-up Information    Services, [facility] Follow up.   Why:  You have screening for possible admission on Monday, 09/22/17 at 8:00AM. Please  bring: Photo ID/proof of [place] residency, medications/prescriptions provided by hospital, and clothing (no leggings). Thank you.  Contact information: [address] [place] [address] [phone]        [facility] Follow up.   Specialty:  Behavioral Health Why:  Please walk in within 3 days of hospital/rehab discharge to be assessed for outpatient mental health services including medication management and therapy. Walk in hours: Monday-Friday 8am-10am. Thank you.  Contact information: [address] [place] [address] [phone]           Plan Of Care/Follow-up recommendations:  Activity:  as tolerated  Diet:  regular- allergic to bananas Tests:  NA Other:  see below  Patient is scheduled for discharge 6/3 in early AM to go to [facility]. She reports being motivated in this disposition option.   Craige Cotta, MD 09/21/2017, 3:01 PM

## 2017-09-21 NOTE — Progress Notes (Signed)
Patient did attend the evening speaker AA meeting.  

## 2017-09-21 NOTE — BHH Group Notes (Signed)
B[facility] Group Notes:  (Nursing/MHT/Case Management/Adjunct)  Date:  09/21/2017  Time:  2:10 PM  Type of Therapy:  Psychoeducational Skills  Participation Level:  Active  Participation Quality:  Appropriate and Attentive  Affect:  Anxious  Cognitive:  Alert and Oriented  Insight:  Good  Engagement in Group:  Engaged  Modes of Intervention:  Activity, Discussion and Education  Summary of Progress/Problems: Pt actively participated in group and was able to identify one personal skill for healthy communication.    Aurora Maskwyman, Zachary Nole E 09/21/2017, 2:10 PM

## 2017-09-21 NOTE — Progress Notes (Signed)
D.  Pt pleasant on approach, [facility]  Pt was positive for evening AA group, has been observed interacting appropriately with peers on the unit.  Pt denies SI/HI/AVH at this time.  Has been supportive of her peers and displayed insight.  A. Support and encouragement offered, medication given as ordered  R.  Pt remains safe on the unit, will continue to monitor.

## 2017-09-21 NOTE — Progress Notes (Signed)
D.  Pt pleasant on approach, no complaints voiced.  Pt was positive for evening AA group, observed engaged in appropriate interaction with peers on the unit. Pt engaged in pleasant conversation about her 22 year old Wellsite geologistdog Skylar.   Pt denies SI/HI/AVH at this time.  A.  Support and encouragement offered, medication given as ordered  R.  Pt remains safe on the unit, will continue to monitor.

## 2017-09-21 NOTE — Discharge Summary (Addendum)
Physician Discharge Summary Note  Patient:  Deborah Fischer is an 22 y.o., female MRN:  [id number] DOB:  [birth year] Patient phone:  [phone] (home)  Patient address:   [address] [place] [address],  Total Time spent with patient: 20 minutes  Date of Admission:  09/17/2017 Date of Discharge: 09/22/17  Reason for Admission:  Worsening depression with polysubstance abuse and SI  Principal Problem: Bipolar affective disorder, depressed, severe (HCC) Discharge Diagnoses: Patient Active Problem List   Diagnosis Date Noted  . Cocaine abuse (HCC) [F14.10] 09/19/2017  . Bipolar affective disorder, depressed, severe (HCC) [F31.4] 09/17/2017  . Bipolar disorder (HCC) [F31.9] 01/14/2017  . Alcohol use disorder, moderate, dependence (HCC) [F10.20] 01/14/2017    Past Psychiatric History: one prior psychiatric [facility] ( [facility] last year for depression and alcohol abuse. States she has been diagnosed with Bipolar Disorder in the past . She reports episodes of increased energy but is unsure if these are drug induced . She does endorse history of depression. Reports history of suicidal ideations but denies history of suicidal attempts, history of self cutting, states she last cut several weeks ago. History of PTSD symptoms, related to "  A mix of childhood and adult stuff". States symptoms have tended to improve overtime.  Reports history of anxiety, which she describes as worrying excessively and occasional panic attacks, denies social phobia.  Past Medical History:  Past Medical History:  Diagnosis Date  . Manic depression (HCC)     Past Surgical History:  Procedure Laterality Date  . WISDOM TOOTH EXTRACTION     Family History: History reviewed. No pertinent family history. Family Psychiatric  History: states aunt has Bipolar Disorder . No suicides in family. Reports history of substance abuse and alcohol abuse in extended family . Brother has history of alcohol abuse   Social  History:  Social History   Substance and Sexual Activity  Alcohol Use Yes  . Alcohol/week: 12.0 oz  . Types: 20 Cans of beer per week   Comment: Daily. Beer: 3-4 bottles of beer. Liquor 1/2 5th (2-3 times a week) Last drink: 2 hours ago,.      Social History   Substance and Sexual Activity  Drug Use Yes  . Frequency: 7.0 times per week  . Types: "Crack" cocaine, Cocaine, Marijuana   Comment: +THC and + COC    Social History   Socioeconomic History  . Marital status: Single    Spouse name: Not on file  . Number of children: Not on file  . Years of education: Not on file  . Highest education level: Not on file  Occupational History  . Not on file  Social Needs  . Financial resource strain: Not on file  . Food insecurity:    Worry: Not on file    Inability: Not on file  . Transportation needs:    Medical: Not on file    Non-medical: Not on file  Tobacco Use  . Smoking status: Current Every Day Smoker    Packs/day: 0.10    Types: Cigarettes  . Smokeless tobacco: Never Used  Substance and Sexual Activity  . Alcohol use: Yes    Alcohol/week: 12.0 oz    Types: 20 Cans of beer per week    Comment: Daily. Beer: 3-4 bottles of beer. Liquor 1/2 5th (2-3 times a week) Last drink: 2 hours ago,.   . Drug use: Yes    Frequency: 7.0 times per week    Types: "Crack" cocaine, Cocaine, Marijuana  Comment: +THC and + COC  . Sexual activity: Not Currently  Lifestyle  . Physical activity:    Days per week: Not on file    Minutes per session: Not on file  . Stress: Not on file  Relationships  . Social connections:    Talks on phone: Not on file    Gets together: Not on file    Attends religious service: Not on file    Active member of club or organization: Not on file    Attends meetings of clubs or organizations: Not on file    Relationship status: Not on file  Other Topics Concern  . Not on file  Social History Narrative  . Not on file    Hospital Course:   09/18/17  [facility] MD Assessment: 22 year old female, presented to hospital voluntarily. States she recently relocated from [place]. Reports history of Cocaine Use Disorder .Reports a prior history of alcohol abuse, but states she completely stopped drinking several months ago. Cocaine use, however, has increased and had become daily. States " I had a great job, was making good money, but was heavy in addiction". " My boss noticed I was losing weight and he told me I needed to take time off to get a handle on my issues ". She decided to come to [place] " because I needed to get away from there, there are drugs everywhere".  She reports feeling depressed and recent suicidal ideations,with thoughts of cutting self . States " I am starting to feel better now, I am thinking of my future more".  Patient remained on the [facility] unit for 4 days. The patient stabilized on medication and therapy. Patient was discharged on Lexapro 10 mg Daily, Vistaril 25 mg Q6H PRN, and Trazodone 100 mg QHS PRN. Patient has shown improvement with improved mood, affect, sleep, appetite, and interaction. Patient has attended group and participated. Patient has been seen in the day room interacting with peers and staff appropriately. Patient denies any SI/HI/AVH and contracts for safety. Patient agrees to follow up at [facility] after [facility] residential treatment. Patient is provided with prescriptions for their medications upon discharge.   Physical Findings: AIMS: Facial and Oral Movements Muscles of Facial Expression: None, normal Lips and Perioral Area: None, normal Jaw: None, normal Tongue: None, normal,Extremity Movements Upper (arms, wrists, hands, fingers): None, normal Lower (legs, knees, ankles, toes): None, normal, Trunk Movements Neck, shoulders, hips: None, normal, Overall Severity Severity of abnormal movements (highest score from questions above): None, normal Incapacitation due to abnormal movements: None, normal Patient's  awareness of abnormal movements (rate only patient's report): No Awareness, Dental Status Current problems with teeth and/or dentures?: No Does patient usually wear dentures?: No  CIWA:  CIWA-Ar Total: 0 COWS:  COWS Total Score: 0  Musculoskeletal: Strength & Muscle Tone: within normal limits Gait & Station: normal Patient leans: N/A  Psychiatric Specialty Exam: Physical Exam  Nursing note and vitals reviewed. Constitutional: She is oriented to person, place, and time. She appears well-developed and well-nourished.  Cardiovascular: Normal rate.  Respiratory: Effort normal.  Musculoskeletal: Normal range of motion.  Neurological: She is alert and oriented to person, place, and time.  Skin: Skin is warm.    Review of Systems  Constitutional: Negative.   HENT: Negative.   Eyes: Negative.   Respiratory: Negative.   Cardiovascular: Negative.   Gastrointestinal: Negative.   Genitourinary: Negative.   Musculoskeletal: Negative.   Skin: Negative.   Neurological: Negative.   Endo/Heme/Allergies: Negative.  Psychiatric/Behavioral: Negative.     Blood pressure 108/66, pulse 69, temperature 98.6 F (37 C), temperature source Oral, resp. rate 18, height 5' 5.5" (1.664 m), weight 59 kg (130 lb), last menstrual period 09/06/2017, SpO2 100 %.Body mass index is 21.3 kg/m.  General Appearance: Casual  Eye Contact:  Good  Speech:  Clear and Coherent and Normal Rate  Volume:  Normal  Mood:  Euthymic  Affect:  Congruent  Thought Process:  Goal Directed and Descriptions of Associations: Intact  Orientation:  Full (Time, Place, and Person)  Thought Content:  WDL  Suicidal Thoughts:  No  Homicidal Thoughts:  No  Memory:  Immediate;   Good Recent;   Good Remote;   Good  Judgement:  Good  Insight:  Good  Psychomotor Activity:  Normal  Concentration:  Concentration: Good and Attention Span: Good  Recall:  Good  Fund of Knowledge:  Good  Language:  Good  Akathisia:  No  Handed:   Right  AIMS (if indicated):     Assets:  Communication Skills Desire for Improvement Physical Health Social Support  ADL's:  Intact  Cognition:  WNL  Sleep:  Number of Hours: 6.25     Have you used any form of tobacco in the last 30 days? (Cigarettes, Smokeless Tobacco, Cigars, and/or Pipes): Yes  Has this patient used any form of tobacco in the last 30 days? (Cigarettes, Smokeless Tobacco, Cigars, and/or Pipes) Yes, Yes, A prescription for an FDA-approved tobacco cessation medication was offered at discharge and the patient refused  Blood Alcohol level:  Lab Results  Component Value Date   ETH <10 09/17/2017   ETH <5 01/13/2017    Metabolic Disorder Labs:  No results found for: HGBA1C, MPG No results found for: PROLACTIN No results found for: CHOL, TRIG, HDL, CHOLHDL, VLDL, LDLCALC  See Psychiatric Specialty Exam and Suicide Risk Assessment completed by Attending Physician prior to discharge.  Discharge destination:  [facility]  Is patient on multiple antipsychotic therapies at discharge:  No   Has Patient had three or more failed trials of antipsychotic monotherapy by history:  No  Recommended Plan for Multiple Antipsychotic Therapies: NA   Allergies as of 09/21/2017      Reactions   Banana Anaphylaxis, Swelling   Penicillins Anaphylaxis, Rash   Has patient had a PCN reaction causing immediate rash, facial/tongue/throat swelling, SOB or lightheadedness with hypotension: Unknown Has patient had a PCN reaction causing severe rash involving mucus membranes or skin necrosis: Unknown Has patient had a PCN reaction that required hospitalization: Unknown Has patient had a PCN reaction occurring within the last 10 years: Unknown If all of the above answers are "NO", then may proceed with Cephalosporin use.      Medication List    STOP taking these medications   nicotine 21 mg/24hr patch Commonly known as:  NICODERM CQ - dosed in mg/24 hours   QUEtiapine 400 MG  tablet Commonly known as:  SEROQUEL     TAKE these medications     Indication  escitalopram 10 MG tablet Commonly known as:  LEXAPRO Take 1 tablet (10 mg total) by mouth daily. Mood control Start taking on:  09/22/2017  Indication:  mood stability   hydrOXYzine 25 MG tablet Commonly known as:  ATARAX/VISTARIL Take 1 tablet (25 mg total) by mouth every 6 (six) hours as needed for anxiety.  Indication:  Feeling Anxious   traZODone 100 MG tablet Commonly known as:  DESYREL Take 1 tablet (100 mg total) by  mouth at bedtime as needed for sleep. What changed:    medication strength  how much to take  when to take this  reasons to take this  Indication:  Trouble Sleeping      Follow-up Information    Services, [facility] Follow up.   Why:  You have screening for possible admission on Monday, 09/22/17 at 8:00AM. Please bring: Photo ID/proof of [place] residency, medications/prescriptions provided by hospital, and clothing (no leggings). Thank you.  Contact information: [address] [place] [address] [phone]        [facility] Follow up.   Specialty:  Behavioral Health Why:  Please walk in within 3 days of hospital/rehab discharge to be assessed for outpatient mental health services including medication management and therapy. Walk in hours: Monday-Friday 8am-10am. Thank you.  [address] [place] [address] [phone]           Follow-up recommendations:  Continue activity as tolerated. Continue diet as recommended by your PCP. Ensure to keep all appointments with outpatient providers.  Comments:  Patient is instructed prior to discharge to: Take all medications as prescribed by his/her mental healthcare provider. Report any adverse effects and or reactions from the medicines to his/her outpatient provider promptly. Patient has been instructed & cautioned: To not engage in alcohol and or illegal drug use while on  prescription medicines. In the event of worsening symptoms, patient is instructed to call the crisis hotline, 911 and or go to the nearest ED for appropriate evaluation and treatment of symptoms. To follow-up with his/her primary care provider for your other medical issues, concerns and or health care needs.    Signed: Gerlene Burdock Money, FNP 09/21/2017, 1:35 PM   Patient seen, Suicide Assessment Completed.  Disposition Plan Reviewed

## 2017-09-22 NOTE — Progress Notes (Deleted)
Recreation Therapy Notes  Date: 6.3.19 Time: 0930 Location: 300 Hall Dayroom  Group Topic: Stress Management  Goal Area(s) Addresses:  Patient will verbalize importance of using healthy stress management.  Patient will identify positive emotions associated with healthy stress management.   Intervention: Stress Management  Activity :  Body Scan Meditation.  LRT introduced the stress management technique of meditation.  LRT played a meditation that focused on scanning the body for any feelings or sensations they may be feeling.  Patients were to follow along as meditation played.  Education:  Stress Management, Discharge Planning.   Education Outcome: Acknowledges edcuation/In group clarification offered/Needs additional education  Clinical Observations/Feedback: Pt did not attend group.    Caroll RancherMarjette Francy Mcilvaine, LRT/CTRS         Lillia AbedLindsay, Toneisha Savary A 09/22/2017 11:29 AM

## 2017-09-22 NOTE — Progress Notes (Signed)
Discharge note:   Pt was discharged to lobby to await [employer] at [phone].  Pt received all discharge paperwork, instructions, prescriptions, and samples.  She also received her [employer] voucher.  Pt denies SI/HI/AVH of any kind . [facility] and about her future.  She received all belongings from locker 38.

## 2017-10-08 ENCOUNTER — Other Ambulatory Visit: Payer: Self-pay

## 2017-10-08 ENCOUNTER — Encounter (HOSPITAL_BASED_OUTPATIENT_CLINIC_OR_DEPARTMENT_OTHER): Payer: Self-pay

## 2017-10-08 ENCOUNTER — Emergency Department (HOSPITAL_BASED_OUTPATIENT_CLINIC_OR_DEPARTMENT_OTHER)
Admission: EM | Admit: 2017-10-08 | Discharge: 2017-10-08 | Disposition: A | Discharge status: Home / Self Care | Payer: Medicaid Other | Attending: Emergency Medicine | Admitting: Emergency Medicine

## 2017-10-08 DIAGNOSIS — Z3202 Encounter for pregnancy test, result negative: Secondary | ICD-10-CM | POA: Insufficient documentation

## 2017-10-08 DIAGNOSIS — N898 Other specified noninflammatory disorders of vagina: Secondary | ICD-10-CM

## 2017-10-08 DIAGNOSIS — Z79899 Other long term (current) drug therapy: Secondary | ICD-10-CM | POA: Insufficient documentation

## 2017-10-08 DIAGNOSIS — A599 Trichomoniasis, unspecified: Secondary | ICD-10-CM | POA: Insufficient documentation

## 2017-10-08 DIAGNOSIS — F17210 Nicotine dependence, cigarettes, uncomplicated: Secondary | ICD-10-CM | POA: Insufficient documentation

## 2017-10-08 HISTORY — DX: Cocaine abuse, uncomplicated: F14.10

## 2017-10-08 LAB — URINALYSIS, ROUTINE W REFLEX MICROSCOPIC
Bilirubin Urine: NEGATIVE
Glucose, UA: NEGATIVE mg/dL
Hgb urine dipstick: NEGATIVE
Ketones, ur: NEGATIVE mg/dL
NITRITE: NEGATIVE
PH: 6 (ref 5.0–8.0)
Protein, ur: NEGATIVE mg/dL

## 2017-10-08 LAB — WET PREP, GENITAL
CLUE CELLS WET PREP: NONE SEEN
SPERM: NONE SEEN
Yeast Wet Prep HPF POC: NONE SEEN

## 2017-10-08 LAB — URINALYSIS, MICROSCOPIC (REFLEX)
RBC / HPF: NONE SEEN RBC/hpf (ref 0–5)
SQUAMOUS EPITHELIAL / LPF: NONE SEEN (ref 0–5)

## 2017-10-08 LAB — PREGNANCY, URINE: Preg Test, Ur: NEGATIVE

## 2017-10-08 MED ORDER — ONDANSETRON 4 MG PO TBDP
4.0000 mg | ORAL_TABLET | Freq: Once | ORAL | Status: AC
  Administered 2017-10-08: 4 mg via ORAL
  Filled 2017-10-08: qty 1

## 2017-10-08 MED ORDER — METRONIDAZOLE 500 MG PO TABS
2000.0000 mg | ORAL_TABLET | Freq: Once | ORAL | Status: AC
  Administered 2017-10-08: 2000 mg via ORAL
  Filled 2017-10-08: qty 4

## 2017-10-08 MED ORDER — AZITHROMYCIN 250 MG PO TABS
1000.0000 mg | ORAL_TABLET | Freq: Once | ORAL | Status: AC
  Administered 2017-10-08: 1000 mg via ORAL
  Filled 2017-10-08: qty 4

## 2017-10-08 MED ORDER — CEFTRIAXONE SODIUM 250 MG IJ SOLR
250.0000 mg | Freq: Once | INTRAMUSCULAR | Status: AC
  Administered 2017-10-08: 250 mg via INTRAMUSCULAR
  Filled 2017-10-08: qty 250

## 2017-10-08 NOTE — Discharge Instructions (Signed)
Medications: You are treated for gonorrhea and chlamydia today as well as trichomonas.  These are one-time doses. Use a condom with every sexual encounter [facility].  Please return to the ER for worsening symptoms, high fevers or persistent vomiting.  You have been tested for chlamydia and gonorrhea. These results will be available in approximately 3 days. You will be notified if they are positive.   It is very important to practice safe sex and use condoms when sexually active. If your results are positive you need to notify all sexual partners so they can be treated as well. The website https://garcia.net/http://www.dontspreadit.com/ can be used to send anonymous text messages or emails to alert sexual contacts.   SEEK IMMEDIATE MEDICAL CARE IF:  You develop an oral temperature above 102 F (38.9 C), not controlled by medications or lasting more than 2 days.  You develop an increase in pain.  You develop vaginal bleeding and it is not time for your period.  You develop painful intercourse.

## 2017-10-08 NOTE — ED Provider Notes (Signed)
[facility] EMERGENCY DEPARTMENT Provider Note   CSN: [id number] Arrival date & time: 10/08/17  1335     History   Chief Complaint Chief Complaint  Patient presents with  . Vaginal Discharge    HPI Deborah Fischer is a 22 y.o. female.  HPI  Patient is a 21-year female with a history of cocaine abuse and bipolar disorder presenting for vaginal discharge.  Patient reports she has noticed it over the last week.  Patient denies any vaginal irritation, pelvic pain, dysuria, urgency, frequency, fevers, or intractable nausea or vomiting.  Patient reports she is sexually active with one female partner within the last 3 months, denies concern for STI exposure.  Patient reports that she was previously tested 2 weeks ago for HIV, syphilis, and hepatitis B and C while in [facility] treatment/recovery services.  Past Medical History:  Diagnosis Date  . Cocaine abuse (HCC)   . Manic depression (HCC)     Patient Active Problem List   Diagnosis Date Noted  . Cocaine dependence with cocaine-induced mood disorder (HCC)   . Cocaine abuse (HCC) 09/19/2017  . Bipolar affective disorder, depressed, severe (HCC) 09/17/2017  . Bipolar disorder (HCC) 01/14/2017  . Alcohol use disorder, moderate, dependence (HCC) 01/14/2017    Past Surgical History:  Procedure Laterality Date  . WISDOM TOOTH EXTRACTION       OB History    Gravida  1   Para      Term      Preterm      AB      Living        SAB      TAB      Ectopic      Multiple      Live Births               Home Medications    Prior to Admission medications   Medication Sig Start Date End Date Taking? Authorizing Provider  escitalopram (LEXAPRO) 10 MG tablet Take 1 tablet (10 mg total) by mouth daily. Mood control 09/22/17   Money, Gerlene Burdock, FNP  hydrOXYzine (ATARAX/VISTARIL) 25 MG tablet Take 1 tablet (25 mg total) by mouth every 6 (six) hours as needed for anxiety. 09/21/17   Money, Gerlene Burdock, FNP  traZODone  (DESYREL) 100 MG tablet Take 1 tablet (100 mg total) by mouth at bedtime as needed for sleep. 09/21/17   Money, Gerlene Burdock, FNP    Family History No family history on file.  Social History Social History   Tobacco Use  . Smoking status: Current Every Day Smoker    Packs/day: 0.10    Types: Cigarettes  . Smokeless tobacco: Never Used  Substance Use Topics  . Alcohol use: Not Currently    Alcohol/week: 12.0 oz    Types: 20 Cans of beer per week    Comment: in rehab  . Drug use: Not Currently    Types: "Crack" cocaine    Comment: in rehab     Allergies   Banana and Penicillins   Review of Systems Review of Systems  Constitutional: Negative for chills and fever.  Gastrointestinal: Negative for abdominal pain, nausea and vomiting.  Genitourinary: Positive for vaginal discharge. Negative for difficulty urinating, dysuria, menstrual problem, pelvic pain and vaginal pain.  Skin: Negative for rash.  All other systems reviewed and are negative.    Physical Exam Updated Vital Signs BP 102/70 (BP Location: Right Arm)   Pulse 75   Temp 98.5 F (36.9 C) (  Oral)   Resp 16   Ht 5\' 6"  (1.676 m)   Wt 62.6 kg (138 lb)   LMP 09/30/2017   SpO2 99%   BMI 22.27 kg/m   Physical Exam  Constitutional: She appears well-developed and well-nourished. No distress.  HENT:  Head: Normocephalic and atraumatic.  Mouth/Throat: Oropharynx is clear and moist.  Eyes: Pupils are equal, round, and reactive to light. Conjunctivae and EOM are normal.  Neck: Normal range of motion. Neck supple.  Cardiovascular: Normal rate, regular rhythm, S1 normal and S2 normal.  No murmur heard. Pulmonary/Chest: Effort normal and breath sounds normal. She has no wheezes. She has no rales.  Abdominal: Soft. She exhibits no distension. There is no tenderness. There is no guarding.  Genitourinary:  Genitourinary Comments: Examination performed with RN chaperone present.  No external lesions of the vagina or  perineum.  No lesions of vaginal walls.  Cervix is non-erythematous and nonfriable.  There is thin milky white discharge throughout the vaginal canal.  No cervical motion tenderness.  No uterine fundus tenderness, or bilateral adnexal tenderness.  Musculoskeletal: Normal range of motion. She exhibits no edema or deformity.  Lymphadenopathy:    She has no cervical adenopathy.  Neurological: She is alert.  Cranial nerves grossly intact. Patient moves extremities symmetrically and with good coordination.  Skin: Skin is warm and dry. No rash noted. No erythema.  Psychiatric: She has a normal mood and affect. Her behavior is normal. Judgment and thought content normal.  Nursing note and vitals reviewed.    ED Treatments / Results  Labs (all labs ordered are listed, but only abnormal results are displayed) Labs Reviewed  WET PREP, GENITAL - Abnormal; Notable for the following components:      Result Value   Trich, Wet Prep PRESENT (*)    WBC, Wet Prep HPF POC MANY (*)    All other components within normal limits  URINALYSIS, ROUTINE W REFLEX MICROSCOPIC - Abnormal; Notable for the following components:   Specific Gravity, Urine <1.005 (*)    Leukocytes, UA TRACE (*)    All other components within normal limits  URINALYSIS, MICROSCOPIC (REFLEX) - Abnormal; Notable for the following components:   Bacteria, UA RARE (*)    All other components within normal limits  PREGNANCY, URINE  GC/CHLAMYDIA PROBE AMP ([place]) NOT AT [facility]    EKG None  Radiology No results found.  Procedures Procedures (including critical care time)  Medications Ordered in ED Medications  cefTRIAXone (ROCEPHIN) injection 250 mg (250 mg Intramuscular Given 10/08/17 1619)  azithromycin (ZITHROMAX) tablet 1,000 mg (1,000 mg Oral Given 10/08/17 1614)  metroNIDAZOLE (FLAGYL) tablet 2,000 mg (2,000 mg Oral Given 10/08/17 1613)  ondansetron (ZOFRAN-ODT) disintegrating tablet 4 mg (4 mg Oral Given 10/08/17 1613)      Initial Impression / Assessment and Plan / ED Course  I have reviewed the triage vital signs and the nursing notes.  Pertinent labs & imaging results that were available during my care of the patient were reviewed by me and considered in my medical decision making (see chart for details).     Patient nontoxic-appearing, afebrile, and in no acute distress.  Patient without any concerning findings for PID.  Wet prep is demonstrating trichomonas and many WBCs.  Pregnancy test is negative.  Urine clear of infection.  Given the trichomonas, will treat for all sexual transmitted infections.  Chart was reviewed, patient clarified multiple times that she has not had true anaphylaxis to penicillins, and discussed with pharmacist,  Amber, proceeding with Rocephin for gonorrhea prophylaxis.  Patient instructed she receive a call for positive results.  Patient can return precautions for any abdominal pain, intractable nausea or vomiting, or fevers with pelvic pain.  Patient is in understanding and agrees with the plan of care.   Final Clinical Impressions(s) / ED Diagnoses   Final diagnoses:  Trichomonas vaginalis infection  Vaginal discharge    ED Discharge Orders    None       Delia ChimesMurray, Emagene Merfeld B, PA-C 10/08/17 1634    Arby BarrettePfeiffer, Marcy, MD 10/09/17 [phone]

## 2017-10-08 NOTE — ED Triage Notes (Signed)
C/o vaginal d/c x 3 weeks-at [facility] x 2 weeks for cocaine use-NAD-steady gait

## 2017-10-09 LAB — GC/CHLAMYDIA PROBE AMP (~~LOC~~) NOT AT ARMC
Chlamydia: NEGATIVE
NEISSERIA GONORRHEA: NEGATIVE

## 2020-10-27 ENCOUNTER — Ambulatory Visit: Payer: Medicaid Other | Admitting: Internal Medicine

## 2021-02-16 ENCOUNTER — Ambulatory Visit (INDEPENDENT_AMBULATORY_CARE_PROVIDER_SITE_OTHER): Payer: Medicaid Other

## 2021-02-16 ENCOUNTER — Other Ambulatory Visit: Payer: Self-pay

## 2021-02-16 VITALS — BP 116/64 | HR 74 | Ht 66.0 in | Wt 160.2 lb

## 2021-02-16 DIAGNOSIS — Z3490 Encounter for supervision of normal pregnancy, unspecified, unspecified trimester: Secondary | ICD-10-CM | POA: Insufficient documentation

## 2021-02-16 DIAGNOSIS — Z3481 Encounter for supervision of other normal pregnancy, first trimester: Secondary | ICD-10-CM

## 2021-02-16 DIAGNOSIS — Z3A08 8 weeks gestation of pregnancy: Secondary | ICD-10-CM

## 2021-02-16 DIAGNOSIS — Z3480 Encounter for supervision of other normal pregnancy, unspecified trimester: Secondary | ICD-10-CM

## 2021-02-16 DIAGNOSIS — O3680X0 Pregnancy with inconclusive fetal viability, not applicable or unspecified: Secondary | ICD-10-CM

## 2021-02-16 MED ORDER — BLOOD PRESSURE KIT DEVI: 1.0000 | 0 refills | Status: AC

## 2021-02-16 NOTE — Progress Notes (Addendum)
New OB Intake  I connected with  Deborah Fischer on 02/16/21 at 10:15 AM EDT by in person and verified that I am speaking with the correct person using two identifiers. Nurse is located at [facility] and pt is located at [place].  I discussed the limitations, risks, security and privacy concerns of performing an evaluation and management service by telephone and the availability of in person appointments. I also discussed with the patient that there may be a patient responsible charge related to this service. The patient expressed understanding and agreed to proceed.  I explained I am completing New OB Intake today. Provider discussed dates with patient, and that there were no fetal heart tones. Provider recommend repeat scan at with OP U/S due to limited scan. Fetal pole measuring [redacted]w[redacted]d by CRL. Not consistent with LMP 12/03/20. Pt is G2/P0101. I reviewed her allergies, medications, Medical/Surgical/OB history, and appropriate screenings. I informed her of [facility] services. Based on history, this is a/an  pregnancy uncomplicated .   Patient Active Problem List   Diagnosis Date Noted   Cocaine dependence with cocaine-induced mood disorder (HCC)    Cocaine abuse (HCC) 09/19/2017   Bipolar affective disorder, depressed, severe (HCC) 09/17/2017   Bipolar disorder (HCC) 01/14/2017   Alcohol use disorder, moderate, dependence (HCC) 01/14/2017    Concerns addressed today  Delivery Plans:  Plans to deliver at [facility].   MyChart/Babyscripts MyChart access verified. I explained pt will have some visits in office and some virtually. Babyscripts instructions given and order placed. Patient verifies receipt of registration text/e-mail. Account successfully created and app downloaded.  Blood Pressure Cuff  Blood pressure cuff ordered for patient to pick-up from [employer]. Explained after first prenatal appt pt will check weekly and document in Babyscripts.  Weight scale: Patient does have weight  scale at home.   Anatomy US Explained first scheduled [place] will be around 19 weeks. Dating and viability scan performed today.  Labs Discussed Avelina Laine genetic screening with patient. Would like both Panorama and Horizon drawn at new OB visit. Routine prenatal labs needed.  Covid Vaccine Patient has not covid vaccine.   Mother/ Baby Dyad Candidate?    If yes, offer as possibility  Informed patient of [facility] website  and placed link in her AVS.   Social Determinants of Health Food Insecurity: Patient denies food insecurity. WIC Referral: Patient is interested in referral to [facility].  Transportation: Patient denies transportation needs. Childcare: Discussed no children allowed at ultrasound appointments. Offered childcare services; patient declines childcare services at this time.  Send link to Pregnancy Navigators   Placed OB Box on problem list and updated  First visit review I reviewed new OB appt with pt. I explained she will have a pelvic exam, ob bloodwork with genetic screening, and PAP smear. Explained pt will be seen by Sharen Counter at first visit; encounter routed to appropriate provider. Explained that patient will be seen by pregnancy navigator following visit with provider. [facility] information placed in AVS.   Hamilton Capri, RN 02/16/2021  10:07 AM

## 2021-02-27 ENCOUNTER — Other Ambulatory Visit: Payer: Self-pay

## 2021-02-27 ENCOUNTER — Ambulatory Visit (INDEPENDENT_AMBULATORY_CARE_PROVIDER_SITE_OTHER): Payer: Medicaid Other | Admitting: Family Medicine

## 2021-02-27 ENCOUNTER — Encounter: Payer: Self-pay | Admitting: Family Medicine

## 2021-02-27 ENCOUNTER — Ambulatory Visit
Admission: RE | Admit: 2021-02-27 | Discharge: 2021-02-27 | Disposition: A | Discharge status: Home / Self Care | Payer: Medicaid Other | Source: Ambulatory Visit | Attending: Obstetrics & Gynecology | Admitting: Obstetrics & Gynecology

## 2021-02-27 VITALS — Wt 161.4 lb

## 2021-02-27 DIAGNOSIS — Z3480 Encounter for supervision of other normal pregnancy, unspecified trimester: Secondary | ICD-10-CM | POA: Insufficient documentation

## 2021-02-27 DIAGNOSIS — O3680X0 Pregnancy with inconclusive fetal viability, not applicable or unspecified: Secondary | ICD-10-CM | POA: Diagnosis present

## 2021-02-27 DIAGNOSIS — O0289 Other abnormal products of conception: Secondary | ICD-10-CM | POA: Insufficient documentation

## 2021-02-27 MED ORDER — ONDANSETRON 4 MG PO TBDP
4.0000 mg | ORAL_TABLET | Freq: Three times a day (TID) | ORAL | 0 refills | Status: DC | PRN
Start: 2021-02-27 — End: 2023-09-03

## 2021-02-27 MED ORDER — MISOPROSTOL 200 MCG PO TABS: 200.0000 ug | ORAL_TABLET | Freq: Four times a day (QID) | ORAL | 0 refills | Status: DC

## 2021-02-27 MED ORDER — OXYCODONE-ACETAMINOPHEN 5-325 MG PO TABS: 1.0000 | ORAL_TABLET | ORAL | 0 refills | Status: DC | PRN

## 2021-02-27 NOTE — Assessment & Plan Note (Signed)
Reviewed [place] results c/w non-viable pregnancy. Declines birth control. Reviewed options, elects for misoprostol, rx sent with refill. Reviewed expectations and MAU precautions.  Due for pap, declines to have it collected today. Will return in 2-4 weeks for follow up SAB and pap smear.

## 2021-02-27 NOTE — Progress Notes (Signed)
GYNECOLOGY OFFICE VISIT NOTE  History:   Deborah Fischer is a 25 y.o. G2P0101 here today for [place] results.  Patient seen for new OB visit at [facility] on 02/16/2021 At that time US showed an IUP with CRL [redacted]w[redacted]d and no fetal cardiac activity c/w definitive non-viable pregnancy Follow up US was obtained today which again showed findings c/w non-viable pregnancy  Results discussed with patient Expecting this news, disappointed No vaginal bleeding or abdominal pain Not interested in birth control 6 months post partum  Health Maintenance Due  Topic Date Due   COVID-19 Vaccine (1) Never done   HPV VACCINES (1 - 2-dose series) Never done   Hepatitis C Screening  Never done   TETANUS/TDAP  Never done   PAP-Cervical Cytology Screening  Never done   PAP SMEAR-Modifier  Never done   INFLUENZA VACCINE  Never done    Past Medical History:  Diagnosis Date   Cocaine abuse (HCC)    Manic depression (HCC)     Past Surgical History:  Procedure Laterality Date   WISDOM TOOTH EXTRACTION      The following portions of the patient's history were reviewed and updated as appropriate: allergies, current medications, past family history, past medical history, past social history, past surgical history and problem list.   Health Maintenance:   Last pap: No results found for: DIAGPAP, HPV, HPVHIGH Needs  Last mammogram:  N/a    Review of Systems:  Pertinent items noted in HPI and remainder of comprehensive ROS otherwise negative.  Physical Exam:  Wt 161 lb 6.4 oz (73.2 kg)   LMP 12/03/2020   BMI 26.05 kg/m  CONSTITUTIONAL: Well-developed, well-nourished female in no acute distress.  HEENT:  Normocephalic, atraumatic. External right and left ear normal. No scleral icterus.  NECK: Normal range of motion, supple, no masses noted on observation SKIN: No rash noted. Not diaphoretic. No erythema. No pallor. MUSCULOSKELETAL: Normal range of motion. No edema noted. NEUROLOGIC: Alert and  oriented to person, place, and time. Normal muscle tone coordination.  PSYCHIATRIC: Normal mood and affect. Normal behavior. Normal judgment and thought content. RESPIRATORY: Effort normal, no problems with respiration noted  Labs and Imaging No results found for this or any previous visit (from the past 168 hour(s)). US OB Limited  Result Date: 02/27/2021 ----------------------------------------------------------------------  OBSTETRICS REPORT                    (Corrected Final 02/27/2021 12:39 pm) ---------------------------------------------------------------------- Patient Info  ID #:       [id number]                          D.O.B.:  [birth year] (24 yrs)  Name:       Deborah Fischer               Visit Date: 02/16/2021 12:01 pm ---------------------------------------------------------------------- Performed By  Attending:        Scheryl Darter MD        Ref. Address:     [address]  [place], [place]                                                             [address]  Performed By:     Angelica Pou RNw4d      Location:         Center for                                                             [facility]  Referred By:      Adam Phenix                    MD ---------------------------------------------------------------------- Orders  #  Description                           Code        Ordered By  1  US OB LIMITED                         46568.1     Scheryl Darter ----------------------------------------------------------------------  #  Order #                     Accession #                Episode #  1  [id number]                   [id number]                 [id number] ----------------------------------------------------------------------  Indications  Less than [redacted] weeks gestation of pregnancy       Z3A.01 ---------------------------------------------------------------------- Fetal Evaluation  Num Of Fetuses:         1  Preg. Location:         Intrauterine  Cardiac Activity:       Not visualized ---------------------------------------------------------------------- Biometry  GS:       25.4  mm     G. Age:  8w 0d                   EDD:   09/28/21  CRL:      13.3  mm     G. Age:  7w 4d  EDD:   10/01/21 ---------------------------------------------------------------------- Gestational Age  Best:          7w 4d      Det. By:  U/S C R L (02/16/21)     EDD:   10/01/21 ---------------------------------------------------------------------- Comments  IUP at [redacted]w[redacted]d by CRL. Follow up u/s scheduled ---------------------------------------------------------------------- Impression  Non-viable first trimester singleton pregnancy ---------------------------------------------------------------------- Recommendations  Follow-up as clinically indicated ----------------------------------------------------------------------                       Scheryl Darter, MD Electronically Signed Corrected Final Report  02/27/2021 12:39 pm ----------------------------------------------------------------------  US OB LESS THAN 14 WEEKS WITH OB TRANSVAGINAL  Result Date: 02/27/2021 CLINICAL DATA:  Supervision of other normal pregnancy antepartum, pregnancy with inconclusive fetal viability, confirm viability; Z34.80, O36.80X0 EXAM: OBSTETRIC <14 WK [place] AND TRANSVAGINAL OB US TECHNIQUE: Both transabdominal and transvaginal ultrasound examinations were performed for complete evaluation of the gestation as well as the maternal uterus, adnexal regions, and pelvic cul-de-sac. Transvaginal technique was performed to assess early pregnancy. COMPARISON:  02/16/2021 FINDINGS: Intrauterine gestational sac: Present, single Yolk sac:  Present Embryo:  Present Cardiac Activity:  Absent Heart Rate: N/A  bpm CRL:  9.7 mm   7 w   0 d                  [place] EDC: 10/16/2021 Subchorionic hemorrhage:  Small to moderate subchorionic hemorrhage Maternal uterus/adnexae: Uterus otherwise unremarkable. Corpus luteum within the RIGHT ovary. Ovaries otherwise normal in appearance. No adnexal masses or free pelvic fluid. IMPRESSION: Gestational sac seen within the uterus containing a yolk sac and fetal pole. Absent fetal cardiac activity. Small to moderate subchorionic hemorrhage. Findings meet definitive criteria for failed pregnancy. This follows SRU consensus guidelines: Diagnostic Criteria for Nonviable Pregnancy Early in the First Trimester. [place] Mis J Med [phone]. Electronically Signed   By: Ulyses Southward M.D.   On: 02/27/2021 14:08      Assessment and Plan:   Problem List Items Addressed This Visit       Other   Non-viable pregnancy - Primary    Reviewed [place] results c/w non-viable pregnancy. Declines birth control. Reviewed options, elects for misoprostol, rx sent with refill. Reviewed expectations and MAU precautions.  Due for pap, declines to have it collected today. Will return in 2-4 weeks for follow up SAB and pap smear.      Relevant Medications   misoprostol (CYTOTEC) 200 MCG tablet   ondansetron (ZOFRAN ODT) 4 MG disintegrating tablet   oxyCODONE-acetaminophen (PERCOCET/ROXICET) 5-325 MG tablet    Routine preventative health maintenance measures emphasized. Please refer to After Visit Summary for other counseling recommendations.   Return in about 4 weeks (around 03/27/2021) for pap.    Total face-to-face time with patient: 30 minutes.  Over 50% of encounter was spent on counseling and coordination of care.   Venora Maples, MD/MPH Attending Family Medicine Physician, [facility] for [facility], [facility]

## 2021-03-02 ENCOUNTER — Inpatient Hospital Stay (HOSPITAL_COMMUNITY)
Admission: AD | Admit: 2021-03-02 | Discharge: 2021-03-02 | Disposition: A | Discharge status: Home / Self Care | Payer: Medicaid Other | Attending: Obstetrics & Gynecology | Admitting: Obstetrics & Gynecology

## 2021-03-02 ENCOUNTER — Inpatient Hospital Stay (HOSPITAL_COMMUNITY): Payer: Medicaid Other

## 2021-03-02 ENCOUNTER — Encounter (HOSPITAL_COMMUNITY): Payer: Self-pay | Admitting: Obstetrics & Gynecology

## 2021-03-02 DIAGNOSIS — O26891 Other specified pregnancy related conditions, first trimester: Secondary | ICD-10-CM | POA: Insufficient documentation

## 2021-03-02 DIAGNOSIS — O039 Complete or unspecified spontaneous abortion without complication: Secondary | ICD-10-CM

## 2021-03-02 DIAGNOSIS — Z3A12 12 weeks gestation of pregnancy: Secondary | ICD-10-CM | POA: Diagnosis not present

## 2021-03-02 DIAGNOSIS — R1030 Lower abdominal pain, unspecified: Secondary | ICD-10-CM | POA: Diagnosis not present

## 2021-03-02 HISTORY — DX: Depression, unspecified: F32.A

## 2021-03-02 HISTORY — DX: Urinary tract infection, site not specified: N39.0

## 2021-03-02 LAB — CBC
HCT: 41.1 % (ref 36.0–46.0)
Hemoglobin: 13.4 g/dL (ref 12.0–15.0)
MCH: 28.9 pg (ref 26.0–34.0)
MCHC: 32.6 g/dL (ref 30.0–36.0)
MCV: 88.6 fL (ref 80.0–100.0)
Platelets: 256 10*3/uL (ref 150–400)
RBC: 4.64 MIL/uL (ref 3.87–5.11)
RDW: 12.3 % (ref 11.5–15.5)
WBC: 9.7 10*3/uL (ref 4.0–10.5)
nRBC: 0 % (ref 0.0–0.2)

## 2021-03-02 LAB — HCG, QUANTITATIVE, PREGNANCY: hCG, Beta Chain, Quant, S: 7956 m[IU]/mL — ABNORMAL HIGH (ref <5)

## 2021-03-02 NOTE — MAU Note (Addendum)
Dx with failed preg on 11/8- no FH on Korea, stopped growing at 7 wks., did not use cytotec.  Passed everything naturally.  Was on phone with ems when it happed, was "feeling like she needed to push a baby out".  Everything happened within the last hour. Having mild cramping now. Small amount of bleeding noted.  Had some diarrhea earlier

## 2021-03-02 NOTE — MAU Provider Note (Signed)
History     CSN: [id number]  Arrival date and time: 03/02/21 1742   Event Date/Time   First Provider Initiated Contact with Patient 03/02/21 1929      Chief Complaint  Patient presents with   Miscarriage   HPI Deborah Fischer is a 25 y.o. G2P0101 at 14w5dwho presents to MAU via EMS shortly after miscarriage at home. [facility] Patient discussed options with Dr. EDione Plover was given a prescription for Cytotec, but once home elected expectant management. She endorses irregular bleeding since Sunday morning 02/25/2021. Her bleeding became noticeably heavier around 4pm today. Patient also experienced new onset lower abdominal pain 10/10. She became very scared, called EMS and believes she miscarried as she was being assessed by EMS.  On arrival to MAU she endorses moderate bleeding. She is not saturating her pad. She reports lower abdominal pain 5/10. She denies weakness, dizziness, syncope.   OB History     Gravida  2   Para  1   Term      Preterm  1   AB      Living  1      SAB      IAB      Ectopic      Multiple      Live Births  1        Obstetric Comments  PTD  "cocaine addiction"         Past Medical History:  Diagnosis Date   Cocaine abuse (HEvan    Depression    in the past, doing well now   Manic depression (HDelhi    UTI (urinary tract infection)     Past Surgical History:  Procedure Laterality Date   WISDOM TOOTH EXTRACTION      History reviewed. No pertinent family history.  Social History   Tobacco Use   Smoking status: Former    Packs/day: 0.10    Types: Cigarettes    Quit date: 2021    Years since quitting: 1.8   Smokeless tobacco: Never  Vaping Use   Vaping Use: Never used  Substance Use Topics   Alcohol use: Not Currently    Alcohol/week: 20.0 standard drinks    Types: 20 Cans of beer per week    Comment: Last drink  in 2021   Drug use: Not Currently    Types: "Crack" cocaine    Comment: Last used in 2021    Allergies:  Allergies  Allergen Reactions   Banana Anaphylaxis and Swelling   Penicillins Rash    Has patient had a PCN reaction causing immediate rash, facial/tongue/throat swelling, SOB or lightheadedness with hypotension: NO Has patient had a PCN reaction causing severe rash involving mucus membranes or skin necrosis: NO Has patient had a PCN reaction that required hospitalization: NO Has patient had a PCN reaction occurring within the last 10 years: Unknown If all of the above answers are "NO", then may proceed with Cephalosporin use.     Medications Prior to Admission  Medication Sig Dispense Refill Last Dose   Prenatal Vit-Fe Sulfate-FA-DHA (PRENATAL VITAMIN/MIN +DHA PO) Take by mouth.   03/02/2021   Blood Pressure Monitoring (BLOOD PRESSURE KIT) DEVI 1 kit by Does not apply route once a week. 1 each 0    misoprostol (CYTOTEC) 200 MCG tablet Take 1 tablet (200 mcg total) by mouth 4 (four) times daily. 20 tablet 0  ondansetron (ZOFRAN ODT) 4 MG disintegrating tablet Take 1 tablet (4 mg total) by mouth every 8 (eight) hours as needed for nausea or vomiting. 20 tablet 0    oxyCODONE-acetaminophen (PERCOCET/ROXICET) 5-325 MG tablet Take 1-2 tablets by mouth every 4 (four) hours as needed for severe pain. 6 tablet 0     Review of Systems  Gastrointestinal:  Positive for abdominal pain.  Genitourinary:  Positive for vaginal bleeding.  All other systems reviewed and are negative. Physical Exam   Blood pressure 111/64, pulse 69, temperature 98.5 F (36.9 C), temperature source Oral, resp. rate 17, last menstrual period 12/03/2020, SpO2 99 %, not currently breastfeeding.  Physical Exam Vitals and nursing note reviewed. Exam conducted with a chaperone present.  Constitutional:      General: She is not in acute distress.    Appearance: Normal appearance. She is not ill-appearing.   Cardiovascular:     Rate and Rhythm: Normal rate and regular rhythm.     Pulses: Normal pulses.     Heart sounds: Normal heart sounds.  Pulmonary:     Effort: Pulmonary effort is normal.     Breath sounds: Normal breath sounds.  Abdominal:     Palpations: Abdomen is soft.  Genitourinary:    Comments: Pelvic exam deferred. Regular pad worn to MAU is not saturated, 1/3 of pad without any bleeding. No clots visualized Skin:    Capillary Refill: Capillary refill takes less than 2 seconds.  Neurological:     Mental Status: She is alert and oriented to person, place, and time.  Psychiatric:        Mood and Affect: Mood normal.        Behavior: Behavior normal.        Thought Content: Thought content normal.        Judgment: Judgment normal.    MAU Course/MDM  Procedures   Orders Placed This Encounter  Procedures   US OB LESS THAN 14 WEEKS WITH OB TRANSVAGINAL   CBC   hCG, quantitative, pregnancy   Discharge patient   Patient Vitals for the past 24 hrs:  BP Temp Temp src Pulse Resp SpO2  03/02/21 1750 -- -- -- -- -- 99 %  03/02/21 1749 111/64 98.5 F (36.9 C) Oral 69 17 --   Results for orders placed or performed during the hospital encounter of 03/02/21 (from the past 24 hour(s))  CBC     Status: None   Collection Time: 03/02/21  6:11 PM  Result Value Ref Range   WBC 9.7 4.0 - 10.5 K/uL   RBC 4.64 3.87 - 5.11 MIL/uL   Hemoglobin 13.4 12.0 - 15.0 g/dL   HCT 41.1 36.0 - 46.0 %   MCV 88.6 80.0 - 100.0 fL   MCH 28.9 26.0 - 34.0 pg   MCHC 32.6 30.0 - 36.0 g/dL   RDW 12.3 11.5 - 15.5 %   Platelets 256 150 - 400 K/uL   nRBC 0.0 0.0 - 0.2 %  hCG, quantitative, pregnancy     Status: Abnormal   Collection Time: 03/02/21  6:11 PM  Result Value Ref Range   hCG, Beta Chain, Quant, S 7,956 (H) <5 mIU/mL   US OB LESS THAN 14 WEEKS WITH OB TRANSVAGINAL  Result Date: 03/02/2021 CLINICAL DATA:  Initial evaluation for recent miscarriage. EXAM: OBSTETRIC <14 WK [place] AND TRANSVAGINAL  OB US TECHNIQUE: Both transabdominal and transvaginal ultrasound examinations were performed for complete evaluation of the gestation as well as the maternal uterus, adnexal  regions, and pelvic cul-de-sac. Transvaginal technique was performed to assess early pregnancy. COMPARISON:  Prior ultrasound from 02/27/2021 FINDINGS: Intrauterine gestational sac: Negative. Yolk sac:  Negative. Embryo:  Negative. Cardiac Activity: Negative. Heart Rate: N/A Subchorionic hemorrhage:  None visualized. Maternal uterus/adnexae: Ovaries within normal limits bilaterally. 1.4 cm degenerating right ovarian corpus luteal cyst with associated small volume free fluid within the pelvis. Uterus is retroverted. Endometrial stripe measures 11 mm in thickness. No associated vascularity. IMPRESSION: 1. Interval SAB/miscarriage. Recently seen IUP is no longer visualized. Endometrial stripe measures 11 mm in thickness with no other sonographic features for retained products of conception. 2. 1.4 cm degenerating right ovarian corpus luteal cyst with associated small volume free physiologic fluid. Electronically Signed   By: Jeannine Boga M.D.   On: 03/02/2021 19:25    Assessment and Plan  --25 y.o. G2P0 --Complete miscarriage confirmed by [place] --Hgb 13.4 --Discharge home in stable condition  Mallie Snooks, [place], MSN, CNM Certified Nurse Midwife, Product/process development scientist for [employer], [place]

## 2021-03-08 ENCOUNTER — Encounter: Payer: Medicaid Other | Admitting: Obstetrics and Gynecology

## 2021-03-19 ENCOUNTER — Encounter: Payer: Medicaid Other | Admitting: Advanced Practice Midwife

## 2021-03-19 ENCOUNTER — Ambulatory Visit: Payer: Medicaid Other | Admitting: Advanced Practice Midwife

## 2023-01-06 IMAGING — US US OB < 14 WEEKS - US OB TV
1 series · 15 of 28 positions shown · non-contrast
Comparison: 02/16/2021

CLINICAL DATA: Supervision of other normal pregnancy antepartum,
pregnancy with inconclusive fetal viability, confirm viability;
Z34.80,

EXAM:
OBSTETRIC <14 WK US AND TRANSVAGINAL OB US
TECHNIQUE: Both transabdominal and transvaginal ultrasound examinations were
performed for complete evaluation of the gestation as well as the
maternal uterus, adnexal regions, and pelvic cul-de-sac.
Transvaginal technique was performed to assess early pregnancy.

[Series 1: us ob < 14 weeks - us ob tv · 15 of 90 slices shown]
[im 1/90]
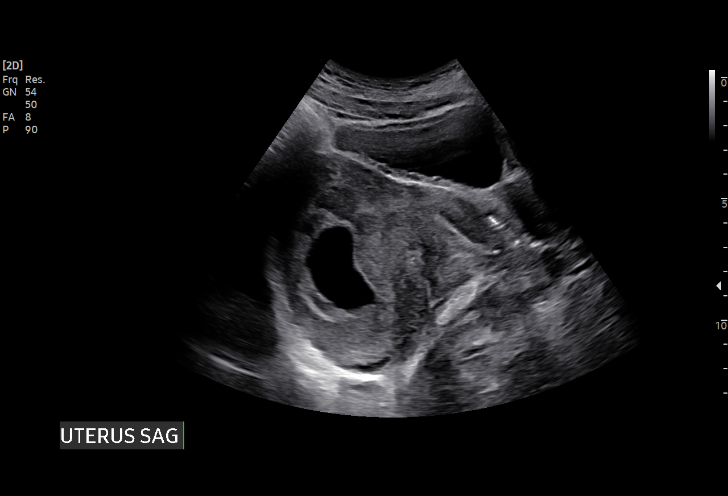
[im 7/90]
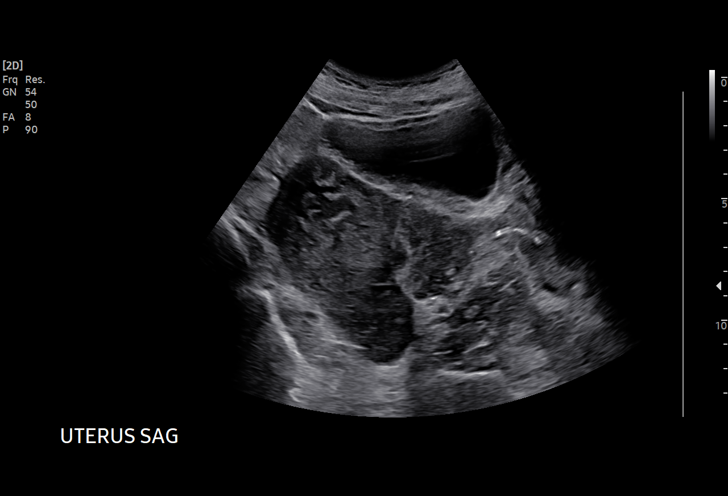
[im 14/90]
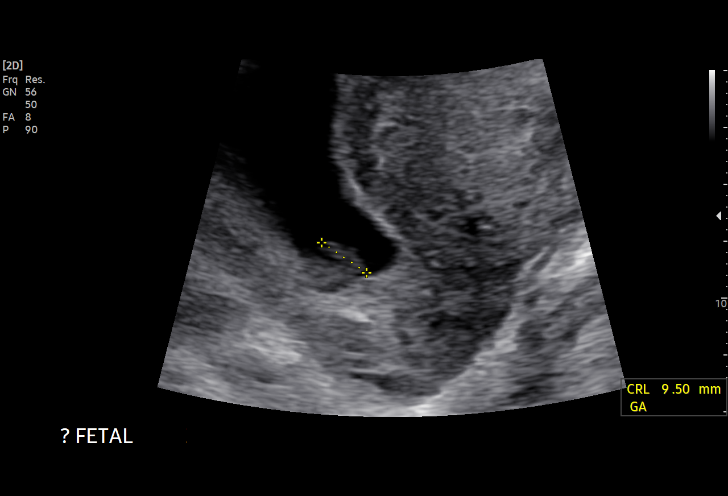
[im 20/90]
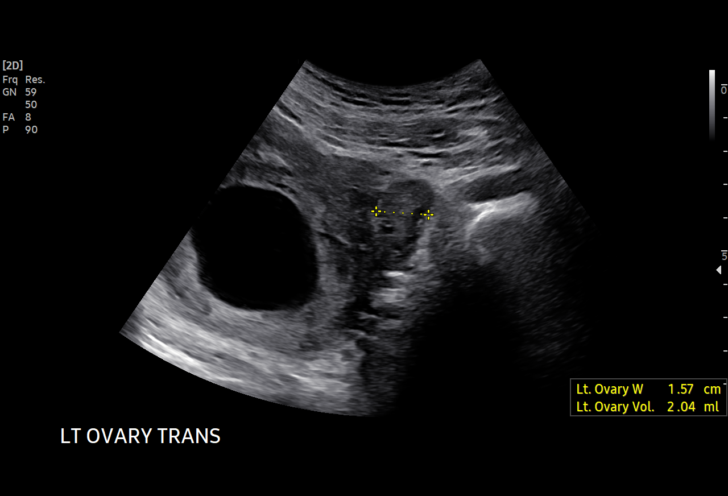
[im 27/90]
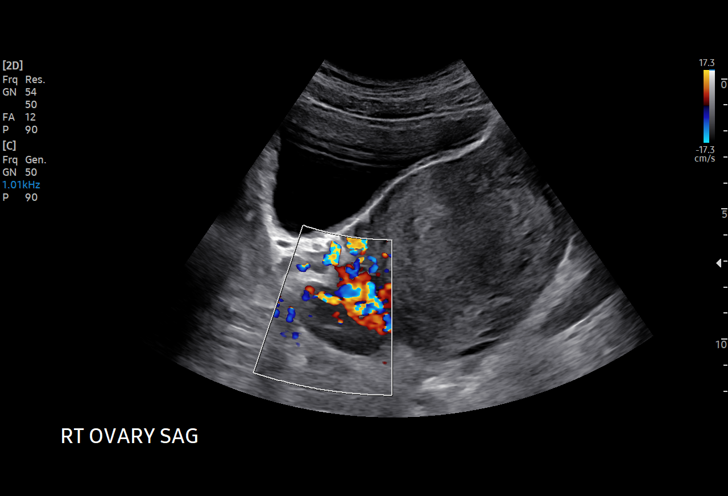
[im 33/90]
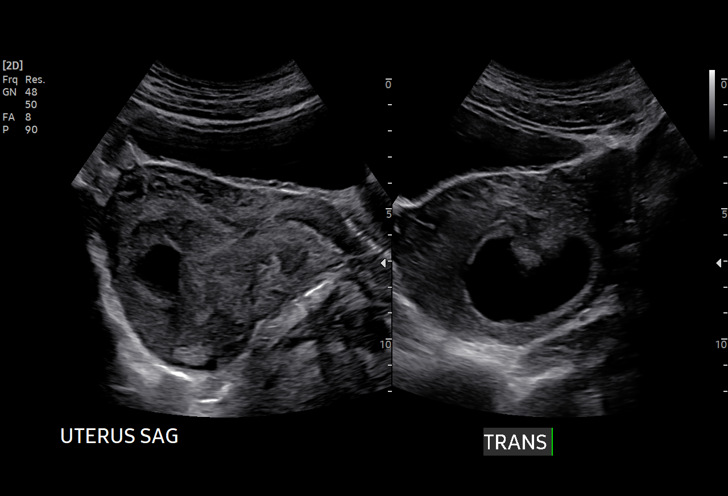
[im 40/90]
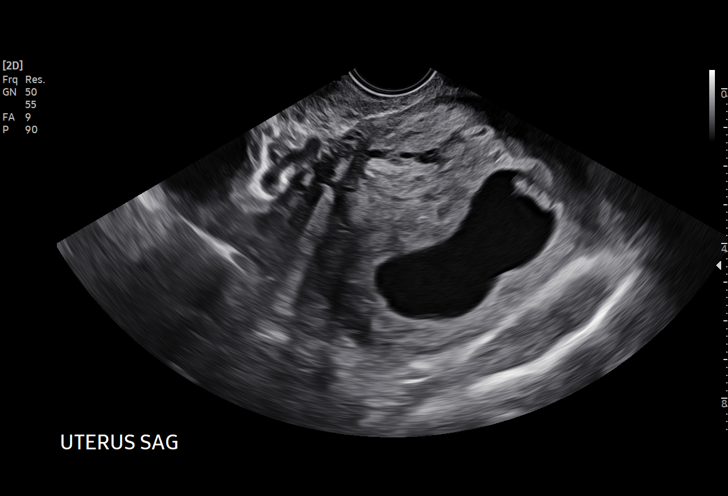
[im 47/90]
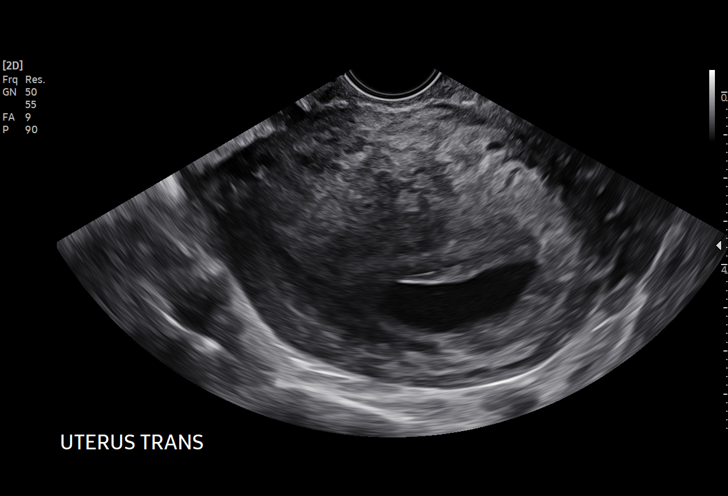
[im 50/90]
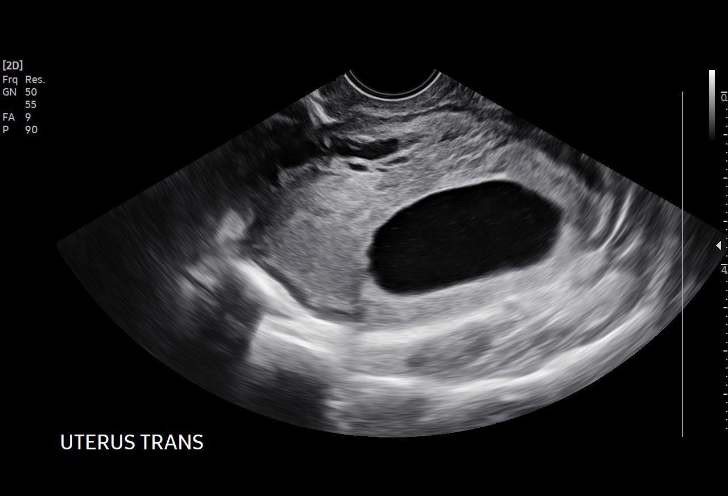
[im 57/90]
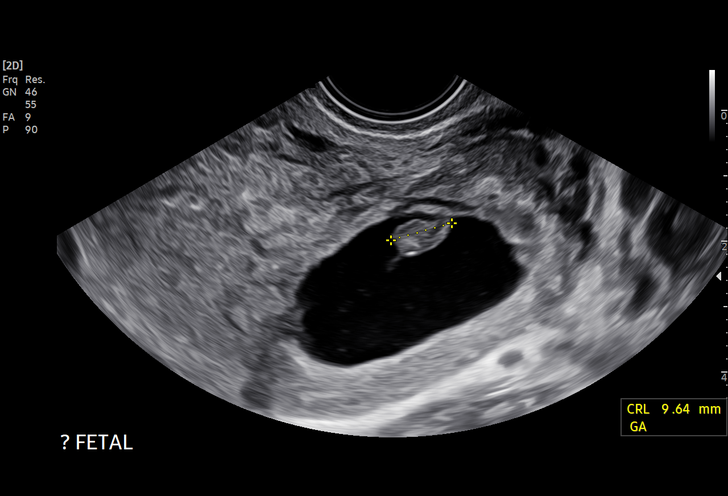
[im 63/90]
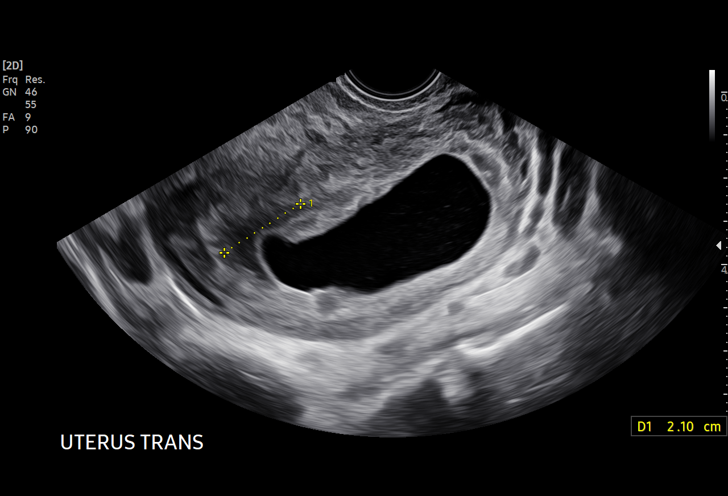
[im 70/90]
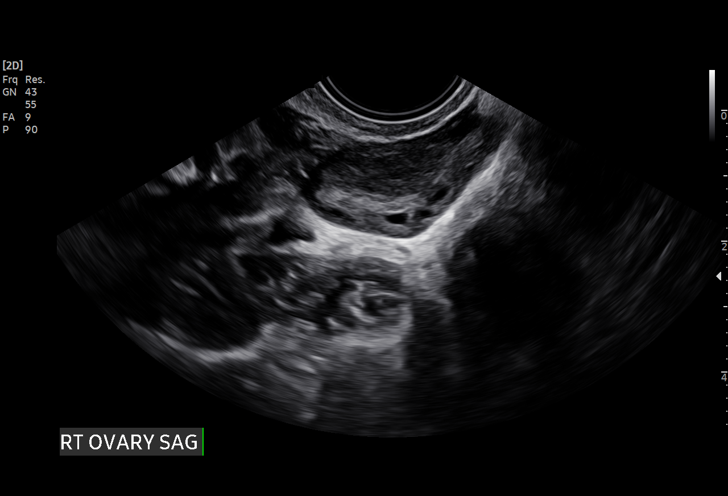
[im 76/90]
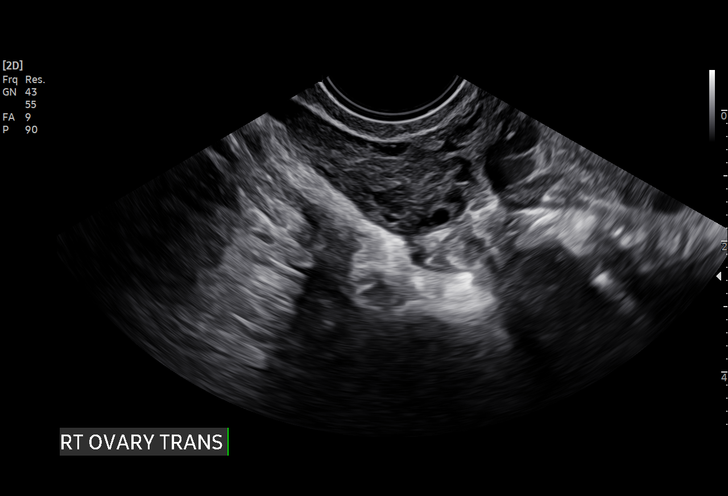
[im 83/90]
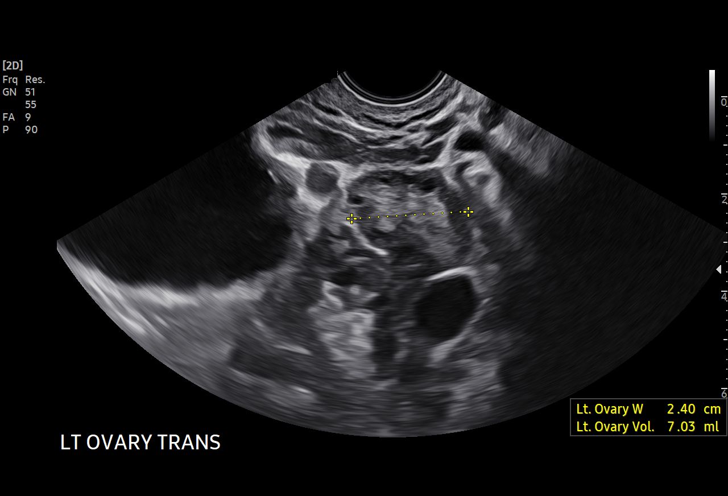
[im 90/90]
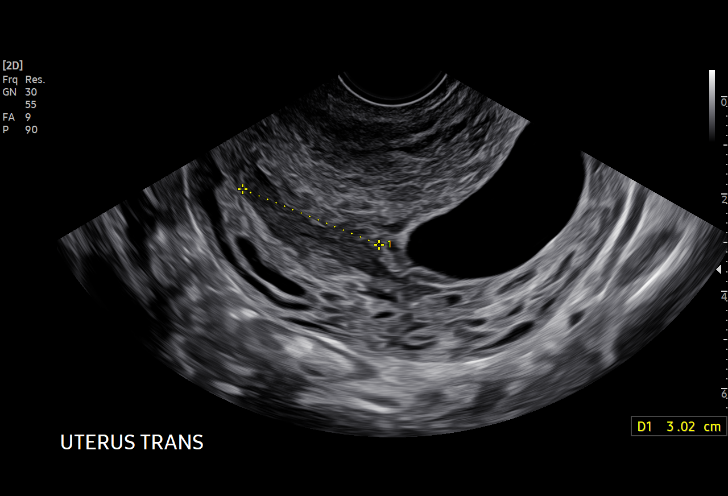

[15 of 28 positions shown; findings below may reference images not displayed]

FINDINGS: Intrauterine gestational sac: Present, single

Yolk sac:  Present

Embryo:  Present

Cardiac Activity: Absent

Heart Rate: N/A  bpm

CRL:  9.7 mm   7 w   0 d                  US EDC: 10/16/2021

Subchorionic hemorrhage:  Small to moderate subchorionic hemorrhage

Maternal uterus/adnexae: Uterus otherwise unremarkable.

Corpus luteum within the RIGHT ovary.

Ovaries otherwise normal in appearance.

No adnexal masses or free pelvic fluid.
IMPRESSION: Gestational sac seen within the uterus containing a yolk sac and
fetal pole.

Absent fetal cardiac activity.

Small to moderate subchorionic hemorrhage.

Findings meet definitive criteria for failed pregnancy. This follows
SRU consensus guidelines: Diagnostic Criteria for Nonviable
Pregnancy Early in the First Trimester. N Engl J Med

## 2023-04-23 NOTE — L&D Delivery Note (Signed)
 Delivery Note At 5:03 AM a viable and healthy female was delivered via Vaginal, Spontaneous (Presentation: Left  Occiput Anterior).  APGAR: 9, 9; weight pending  .   Placenta status: Spontaneous, Intact.  Cord: 3 vessels with the following complications: None.   The patient pushed for approximately 10 minutes and delivered a vigorous female infant in the vertex left occiput anterior presentation with Apgar scores of 9 at 1 minute and 9 at 5 minutes.  Following delivery the infant was passed to the waiting maternal abdomen and cried vigorously and was bulb suctioned.  Following a 1 minute delay the umbilical cord was clamped and cut.  The placenta was delivered spontaneously, intact, with three-vessel cord.  No perineal lacerations required repair.  All sponge, needle, instrument counts were correct.  Mother and baby are doing well following delivery.  Anesthesia: Epidural Episiotomy: None Lacerations:  none Suture Repair: NA Est. Blood Loss (mL):  127  Mom to postpartum.  Baby to Couplet care / Skin to Skin.  Marjorie Gull 03/05/2024, 5:22 AM

## 2023-08-09 ENCOUNTER — Other Ambulatory Visit: Payer: Self-pay

## 2023-08-09 ENCOUNTER — Encounter (HOSPITAL_COMMUNITY): Payer: Self-pay | Admitting: Obstetrics & Gynecology

## 2023-08-09 ENCOUNTER — Inpatient Hospital Stay (HOSPITAL_COMMUNITY)

## 2023-08-09 ENCOUNTER — Inpatient Hospital Stay (HOSPITAL_COMMUNITY)
Admission: AD | Admit: 2023-08-09 | Discharge: 2023-08-09 | Disposition: A | Discharge status: Home / Self Care | Attending: Obstetrics & Gynecology | Admitting: Obstetrics & Gynecology

## 2023-08-09 DIAGNOSIS — O418X10 Other specified disorders of amniotic fluid and membranes, first trimester, not applicable or unspecified: Secondary | ICD-10-CM | POA: Diagnosis not present

## 2023-08-09 DIAGNOSIS — Z3A08 8 weeks gestation of pregnancy: Secondary | ICD-10-CM | POA: Insufficient documentation

## 2023-08-09 DIAGNOSIS — N939 Abnormal uterine and vaginal bleeding, unspecified: Secondary | ICD-10-CM

## 2023-08-09 DIAGNOSIS — O208 Other hemorrhage in early pregnancy: Secondary | ICD-10-CM | POA: Diagnosis present

## 2023-08-09 DIAGNOSIS — Z711 Person with feared health complaint in whom no diagnosis is made: Secondary | ICD-10-CM

## 2023-08-09 DIAGNOSIS — Z3A01 Less than 8 weeks gestation of pregnancy: Secondary | ICD-10-CM

## 2023-08-09 DIAGNOSIS — O468X1 Other antepartum hemorrhage, first trimester: Secondary | ICD-10-CM

## 2023-08-09 LAB — URINALYSIS, ROUTINE W REFLEX MICROSCOPIC
Bacteria, UA: NONE SEEN
Bilirubin Urine: NEGATIVE
Glucose, UA: NEGATIVE mg/dL
Ketones, ur: NEGATIVE mg/dL
Leukocytes,Ua: NEGATIVE
Nitrite: NEGATIVE
Protein, ur: NEGATIVE mg/dL
Specific Gravity, Urine: 1.013 (ref 1.005–1.030)
pH: 5 (ref 5.0–8.0)

## 2023-08-09 LAB — CBC
HCT: 41 % (ref 36.0–46.0)
Hemoglobin: 14 g/dL (ref 12.0–15.0)
MCH: 31.3 pg (ref 26.0–34.0)
MCHC: 34.1 g/dL (ref 30.0–36.0)
MCV: 91.5 fL (ref 80.0–100.0)
Platelets: 331 10*3/uL (ref 150–400)
RBC: 4.48 MIL/uL (ref 3.87–5.11)
RDW: 11.1 % — ABNORMAL LOW (ref 11.5–15.5)
WBC: 8.3 10*3/uL (ref 4.0–10.5)
nRBC: 0 % (ref 0.0–0.2)

## 2023-08-09 LAB — WET PREP, GENITAL
Sperm: NONE SEEN
Trich, Wet Prep: NONE SEEN
WBC, Wet Prep HPF POC: >=10 — AB (ref <10)
Yeast Wet Prep HPF POC: NONE SEEN

## 2023-08-09 LAB — HCG, QUANTITATIVE, PREGNANCY: hCG, Beta Chain, Quant, S: 102214 m[IU]/mL — ABNORMAL HIGH (ref <5)

## 2023-08-09 LAB — POCT PREGNANCY, URINE: Preg Test, Ur: POSITIVE — AB

## 2023-08-09 LAB — ABO/RH: ABO/RH(D): A POS

## 2023-08-09 NOTE — MAU Note (Signed)
 Deborah Fischer is a 28 y.o. at Unknown here in MAU reporting: she had a positive HPT, had an ultrasound on 07/24/2023 at ED in [place] and was informed she has a subchorionic hemorrhage.  States approximately 2 hours ago had light VB that is now lessening.  Also reports having mild lower abdominal cramping, that's "manageable."  LMP: irregular (6 months ago) Onset of complaint: today Pain score: 5 Vitals:   08/09/23 1838  BP: 105/67  Pulse: 82  Resp: 18  Temp: 98.4 F (36.9 C)  SpO2: 99%     FHT: NA  Lab orders placed from triage: UPT

## 2023-08-10 NOTE — MAU Provider Note (Signed)
 History    Chief Complaint  Patient presents with   Abdominal Pain   Vaginal Bleeding   Deborah Fischer , a  28 y.o. G3P0101 at Unknown presents to MAU with complaints of vaginal bleeding after being diagnosed with a [facility]. Patient states that she was seen in an ED last week for vaginal bleeding in 1st trimester. Today she reports some mild abdominal cramping which is a new finding. She states that cramps started while at work. Was rating pain a 7/10 but denies attempting to relieve symptoms. She reports pain improved after waiting in MAU. She also noted some light brown vaginal spotting. Denies bright red heavy vaginal bleeding or passing clots. Denies abnormal vaginal discharge or urinary symptoms.           OB History     Gravida  3   Para  1   Term      Preterm  1   AB      Living  1      SAB      IAB      Ectopic      Multiple      Live Births  1        Obstetric Comments  PTD  "cocaine addiction"         Past Medical History:  Diagnosis Date   Cocaine abuse (HCC)    Depression    in the past, doing well now   Manic depression (HCC)    UTI (urinary tract infection)     Past Surgical History:  Procedure Laterality Date   WISDOM TOOTH EXTRACTION      No family history on file.  Social History   Tobacco Use   Smoking status: Former    Current packs/day: 0.00    Types: Cigarettes    Quit date: 2021    Years since quitting: 4.3   Smokeless tobacco: Never  Vaping Use   Vaping status: Never Used  Substance Use Topics   Alcohol use: Not Currently    Alcohol/week: 20.0 standard drinks of alcohol    Types: 20 Cans of beer per week    Comment: Last drink in 2021   Drug use: Not Currently    Types: "Crack" cocaine    Comment: Last used in 2021    Allergies:  Allergies  Allergen Reactions   Banana Anaphylaxis and Swelling   Penicillins Rash    Has patient had a PCN reaction causing immediate rash, facial/tongue/throat swelling, SOB  or lightheadedness with hypotension: NO Has patient had a PCN reaction causing severe rash involving mucus membranes or skin necrosis: NO Has patient had a PCN reaction that required hospitalization: NO Has patient had a PCN reaction occurring within the last 10 years: Unknown If all of the above answers are "NO", then may proceed with Cephalosporin use.     No medications prior to admission.    Review of Systems  Constitutional:  Negative for chills, fatigue and fever.  Eyes:  Negative for pain and visual disturbance.  Respiratory:  Negative for apnea, shortness of breath and wheezing.   Cardiovascular:  Positive for chest pain. Negative for palpitations.  Gastrointestinal:  Negative for abdominal pain, constipation, diarrhea, nausea and vomiting.  Genitourinary:  Positive for pelvic pain. Negative for difficulty urinating, dysuria, vaginal bleeding, vaginal discharge and vaginal pain.  Musculoskeletal:  Negative for back pain.  Neurological:  Negative for seizures, weakness and headaches.  Psychiatric/Behavioral:  Negative for suicidal ideas.    Physical  Exam Blood pressure 105/67, pulse 82, temperature 98.4 F (36.9 C), temperature source Oral, resp. rate 18, height 5\' 7"  (1.702 m), weight 65.2 kg, SpO2 99%, unknown if currently breastfeeding. Physical Exam Vitals and nursing note reviewed.  Constitutional:      General: She is not in acute distress.    Appearance: Normal appearance.  HENT:     Head: Normocephalic.  Pulmonary:     Effort: Pulmonary effort is normal.  Abdominal:     Tenderness: There is no abdominal tenderness. There is no guarding.  Musculoskeletal:     Cervical back: Normal range of motion.  Skin:    General: Skin is warm and dry.  Neurological:     Mental Status: She is alert and oriented to person, place, and time.  Psychiatric:        Mood and Affect: Mood normal.      MAU Course Procedures Orders Placed This Encounter  Procedures   Wet  prep, genital   US  OB Comp Less 14 Wks   Urinalysis, Routine w reflex microscopic -Urine, Clean Catch   CBC   hCG, quantitative, pregnancy   Pregnancy, urine POC   ABO/Rh   Discharge patient Discharge disposition: 01-Home or Self Care; Discharge patient date: 08/09/2023   US  OB Comp Less 14 Wks Result Date: 08/09/2023 CLINICAL DATA:  Vaginal bleeding EXAM: OBSTETRIC <14 WK ULTRASOUND TECHNIQUE: Transabdominal ultrasound was performed for evaluation of the gestation as well as the maternal uterus and adnexal regions. COMPARISON:  07/24/2023 FINDINGS: Intrauterine gestational sac: Single intrauterine gestation. Yolk sac:  Visualized Embryo:  Visualized Cardiac Activity: Visualized Heart Rate: 182 bpm CRL:   18.6 mm   8 w 3 d                  US  EDC: 03/17/2024 Subchorionic hemorrhage: Large subchorionic hemorrhage which appears increased compared to prior, measuring up to 4.3 cm maximum transverse diameter. Maternal uterus/adnexae: Ovaries are within normal limits. Right ovary measures 2.4 by 2.2 x 2.6 cm. Left ovary measures 3 x 2 x 1.9 cm. No significant free fluid. IMPRESSION: 1. Single viable intrauterine pregnancy 2. Increased subchorionic hemorrhage, now appears large in size. Electronically Signed   By: Esmeralda Hedge M.D.   On: 08/09/2023 21:59    . MDM - US  noted a single living IUP consistent with dating,  -  Moderate amount of Hbg in urine but otherwise noted  - Wet prep positive for BV. Metronidazole  sent to pharmacy.  -H&H normal patient hemodynamically stable.  - HCG leves >100,000 consistent with dates  - US  also noted a subchorionic hematoma, slightly increased from previous imaging.  - plan for discharge   1. Physically well but worried   2. Less than [redacted] weeks gestation of pregnancy   3. Vaginal bleeding   4. Subchorionic hematoma in first trimester, single or unspecified fetus    - Reviewed that bleeding is likely related to subchorionic hematoma.  - Reviewed worsening signs  and return precautions.  - Reviewed [employer] and bleeding expectations - Patient discharged home in stable condition and may return to MAU as needed,       Bettyjane Shenoy Maurie Southern) Marlys Singh, MSN, CNM  [facility] [employer]  08/10/2023 7:00 AM

## 2023-08-11 LAB — GC/CHLAMYDIA PROBE AMP (~~LOC~~) NOT AT ARMC
Chlamydia: NEGATIVE
Comment: NEGATIVE
Comment: NORMAL
Neisseria Gonorrhea: NEGATIVE

## 2023-09-03 ENCOUNTER — Encounter (HOSPITAL_COMMUNITY): Payer: Self-pay | Admitting: Obstetrics & Gynecology

## 2023-09-03 ENCOUNTER — Inpatient Hospital Stay (HOSPITAL_COMMUNITY)
Admission: AD | Admit: 2023-09-03 | Discharge: 2023-09-03 | Disposition: A | Discharge status: Home / Self Care | Attending: Family Medicine | Admitting: Family Medicine

## 2023-09-03 DIAGNOSIS — O99281 Endocrine, nutritional and metabolic diseases complicating pregnancy, first trimester: Secondary | ICD-10-CM | POA: Diagnosis not present

## 2023-09-03 DIAGNOSIS — O99891 Other specified diseases and conditions complicating pregnancy: Secondary | ICD-10-CM | POA: Insufficient documentation

## 2023-09-03 DIAGNOSIS — E860 Dehydration: Secondary | ICD-10-CM | POA: Insufficient documentation

## 2023-09-03 DIAGNOSIS — O219 Vomiting of pregnancy, unspecified: Secondary | ICD-10-CM

## 2023-09-03 DIAGNOSIS — Z3A12 12 weeks gestation of pregnancy: Secondary | ICD-10-CM | POA: Diagnosis not present

## 2023-09-03 DIAGNOSIS — O0289 Other abnormal products of conception: Secondary | ICD-10-CM

## 2023-09-03 DIAGNOSIS — R197 Diarrhea, unspecified: Secondary | ICD-10-CM

## 2023-09-03 LAB — URINALYSIS, ROUTINE W REFLEX MICROSCOPIC
Bilirubin Urine: NEGATIVE
Glucose, UA: NEGATIVE mg/dL
Hgb urine dipstick: NEGATIVE
Ketones, ur: 20 mg/dL — AB
Nitrite: NEGATIVE
Protein, ur: 30 mg/dL — AB
Specific Gravity, Urine: 1.025 (ref 1.005–1.030)
pH: 5 (ref 5.0–8.0)

## 2023-09-03 LAB — COMPREHENSIVE METABOLIC PANEL WITH GFR
ALT: 14 U/L (ref 0–44)
AST: 29 U/L (ref 15–41)
Albumin: 3.6 g/dL (ref 3.5–5.0)
Alkaline Phosphatase: 29 U/L — ABNORMAL LOW (ref 38–126)
Anion gap: 12 (ref 5–15)
BUN: <5 mg/dL — ABNORMAL LOW (ref 6–20)
CO2: 21 mmol/L — ABNORMAL LOW (ref 22–32)
Calcium: 9.4 mg/dL (ref 8.9–10.3)
Chloride: 101 mmol/L (ref 98–111)
Creatinine, Ser: 0.61 mg/dL (ref 0.44–1.00)
GFR, Estimated: >60 mL/min (ref >60)
Glucose, Bld: 89 mg/dL (ref 70–99)
Potassium: 3.4 mmol/L — ABNORMAL LOW (ref 3.5–5.1)
Sodium: 134 mmol/L — ABNORMAL LOW (ref 135–145)
Total Bilirubin: 0.8 mg/dL (ref 0.0–1.2)
Total Protein: 7.4 g/dL (ref 6.5–8.1)

## 2023-09-03 MED ORDER — LOPERAMIDE HCL 2 MG PO CAPS
4.0000 mg | ORAL_CAPSULE | Freq: Once | ORAL | Status: AC
  Administered 2023-09-03: 4 mg via ORAL
  Filled 2023-09-03: qty 2

## 2023-09-03 MED ORDER — ONDANSETRON 8 MG PO TBDP: 8.0000 mg | ORAL_TABLET | Freq: Three times a day (TID) | ORAL | 1 refills | Status: AC | PRN

## 2023-09-03 MED ORDER — ONDANSETRON 4 MG PO TBDP
8.0000 mg | ORAL_TABLET | Freq: Once | ORAL | Status: AC
  Administered 2023-09-03: 8 mg via ORAL
  Filled 2023-09-03: qty 2

## 2023-09-03 NOTE — MAU Provider Note (Signed)
 History of Present Illness S: The patient, currently 12.[redacted] weeks pregnant, presents with severe vomiting and diarrhea.  Symptoms began on Monday evening with an initial feeling of unwellness. Vomiting started after attempts to eat and persisted throughout Tuesday, with an inability to retain food, drink, or prenatal vitamins. The smell of pizza worsens the vomiting. Diarrhea developed by Tuesday afternoon, with more than five watery episodes in the past 24 hours, occurring after any intake. Stomach cramps are present, located higher in the abdomen than typical menstrual cramps. Denies sick contacts.   No fever is noted, but chills were experienced the previous day. Mild nausea was present earlier in the pregnancy, but not to the current extent. During a previous pregnancy, severe vomiting occurred, but eating and functioning were still possible.  O: Patient Vitals for the past 24 hrs:  BP Temp Pulse Resp  09/03/23 0909 108/79 97.8 F (36.6 C) 75 18  Physical Exam Constitutional:      General: She is not in acute distress.    Appearance: Normal appearance. She is not ill-appearing or toxic-appearing.  HENT:     Mouth/Throat:     Mouth: Mucous membranes are moist.  Eyes:     Conjunctiva/sclera: Conjunctivae normal.  Cardiovascular:     Rate and Rhythm: Normal rate.  Pulmonary:     Effort: Pulmonary effort is normal. No respiratory distress.  Abdominal:     Tenderness: There is no abdominal tenderness.  Skin:    General: Skin is warm and dry.  Neurological:     Mental Status: She is alert and oriented to person, place, and time.  Psychiatric:        Mood and Affect: Mood normal.     Results  Results for orders placed or performed during the hospital encounter of 09/03/23 (from the past 24 hours)  Urinalysis, Routine w reflex microscopic -Urine, Clean Catch     Status: Abnormal   Collection Time: 09/03/23  9:04 AM  Result Value Ref Range   Color, Urine AMBER (A) YELLOW    APPearance HAZY (A) CLEAR   Specific Gravity, Urine 1.025 1.005 - 1.030   pH 5.0 5.0 - 8.0   Glucose, UA NEGATIVE NEGATIVE mg/dL   Hgb urine dipstick NEGATIVE NEGATIVE   Bilirubin Urine NEGATIVE NEGATIVE   Ketones, ur 20 (A) NEGATIVE mg/dL   Protein, ur 30 (A) NEGATIVE mg/dL   Nitrite NEGATIVE NEGATIVE   Leukocytes,Ua TRACE (A) NEGATIVE   RBC / HPF 0-5 0 - 5 RBC/hpf   WBC, UA 6-10 0 - 5 WBC/hpf   Bacteria, UA MANY (A) NONE SEEN   Squamous Epithelial / HPF 6-10 0 - 5 /HPF   Mucus PRESENT   Comprehensive metabolic panel     Status: Abnormal   Collection Time: 09/03/23  9:05 AM  Result Value Ref Range   Sodium 134 (L) 135 - 145 mmol/L   Potassium 3.4 (L) 3.5 - 5.1 mmol/L   Chloride 101 98 - 111 mmol/L   CO2 21 (L) 22 - 32 mmol/L   Glucose, Bld 89 70 - 99 mg/dL   BUN <5 (L) 6 - 20 mg/dL   Creatinine, Ser 6.96 0.44 - 1.00 mg/dL   Calcium 9.4 8.9 - 29.5 mg/dL   Total Protein 7.4 6.5 - 8.1 g/dL   Albumin 3.6 3.5 - 5.0 g/dL   AST 29 15 - 41 U/L   ALT 14 0 - 44 U/L   Alkaline Phosphatase 29 (L) 38 - 126 U/L   Total  Bilirubin 0.8 0.0 - 1.2 mg/dL   GFR, Estimated >62 >95 mL/min   Anion gap 12 5 - 15     Assessment and Plan Gastroenteritis Acute vomiting and diarrhea, likely viral gastroenteritis or food poisoning. Potassium slightly low, borderline dehydration. No fever, chills present. No C. difficile risk factors. - Administer Zofran . Excellent response. Tolerating liquids and crackers. Reports feeling much better.  - Encourage ice chips and ginger ale to assess fluid tolerance. - Imodium , up to four tablets in 24 hours. - Discuss potential need for IV fluids if oral intake fails. - C. difficile testing ordered but pt did not have BM while in MAU.  - Recommend soap and water hand washing, cleaning high-touch surfaced at home with bleach.   D/C home in stable condition Gastroenteritis precautions.  First trimester precautions.   Follow-up Information     [facility] Follow up.   Why: Start prenatal care        [facility] Follow up.   Specialty: Obstetrics and Gynecology Why: As needed if symptoms worsen Contact information: [address] [place]  [phone]               Allergies as of 09/03/2023       Reactions   Banana Anaphylaxis, Swelling   Penicillins Rash   Has patient had a PCN reaction causing immediate rash, facial/tongue/throat swelling, SOB or lightheadedness with hypotension: NO Has patient had a PCN reaction causing severe rash involving mucus membranes or skin necrosis: NO Has patient had a PCN reaction that required hospitalization: NO Has patient had a PCN reaction occurring within the last 10 years: Unknown If all of the above answers are "NO", then may proceed with Cephalosporin use.        Medication List     STOP taking these medications    oxyCODONE -acetaminophen  5-325 MG tablet Commonly known as: PERCOCET/ROXICET       TAKE these medications    Blood Pressure Kit Devi 1 kit by Does not apply route once a week.   ondansetron  8 MG disintegrating tablet Commonly known as: ZOFRAN -ODT Take 1 tablet (8 mg total) by mouth every 8 (eight) hours as needed for nausea or vomiting. What changed:  medication strength how much to take   PRENATAL VITAMIN/MIN +DHA PO Take by mouth.       Dickie Labarre  Benard Brackett 09/03/2023 1:17 PM

## 2023-09-03 NOTE — MAU Note (Signed)
 Deborah Fischer is a 28 y.o. at [redacted]w[redacted]d here in MAU reporting: has not been able to keep anything down since Monday. Started with diarrhea yesterday. Not sure if she has food poisoning.   LMP:  Onset of complaint: Monday Pain score:  Vitals:   09/03/23 0909  BP: 108/79  Pulse: 75  Resp: 18  Temp: 97.8 F (36.6 C)     FHT: not able in triage  Lab orders placed from triage: u/a

## 2023-09-04 LAB — CULTURE, OB URINE

## 2023-09-08 ENCOUNTER — Ambulatory Visit: Admission: EM | Admit: 2023-09-08 | Discharge: 2023-09-08 | Disposition: A | Discharge status: Home / Self Care

## 2023-09-08 DIAGNOSIS — N898 Other specified noninflammatory disorders of vagina: Secondary | ICD-10-CM | POA: Insufficient documentation

## 2023-09-08 DIAGNOSIS — O26891 Other specified pregnancy related conditions, first trimester: Secondary | ICD-10-CM | POA: Insufficient documentation

## 2023-09-08 DIAGNOSIS — Z3A13 13 weeks gestation of pregnancy: Secondary | ICD-10-CM | POA: Insufficient documentation

## 2023-09-08 NOTE — ED Triage Notes (Signed)
"  I am [redacted] wks pregnant and have recurrent BV, I have contacted GYN who recommended this time that she should be seen at Urgent Care with swab/order".

## 2023-09-08 NOTE — ED Provider Notes (Signed)
 [facility]    CSN: [id number] Arrival date & time: 09/08/23  1353      History   Chief Complaint Chief Complaint  Patient presents with   Vaginal Problem    HPI Deborah Fischer is a 28 y.o. female.   Patient presents today for evaluation of possible BV.  She reports that she has had some vaginal discharge.  She is currently [redacted] weeks pregnant.  She does not report any known STD exposures.  The history is provided by the patient.    Past Medical History:  Diagnosis Date   Cocaine abuse (HCC)    Depression    in the past, doing well now   Manic depression (HCC)    UTI (urinary tract infection)     Patient Active Problem List   Diagnosis Date Noted   Supervision of normal pregnancy 02/16/2021   Cocaine dependence with cocaine-induced mood disorder (HCC)    Cocaine abuse (HCC) 09/19/2017   Bipolar affective disorder, depressed, severe (HCC) 09/17/2017   Bipolar disorder (HCC) 01/14/2017   Alcohol use disorder, moderate, dependence (HCC) 01/14/2017    Past Surgical History:  Procedure Laterality Date   WISDOM TOOTH EXTRACTION      OB History     Gravida  3   Para  1   Term      Preterm  1   AB  1   Living  1      SAB  1   IAB      Ectopic      Multiple      Live Births  1        Obstetric Comments  PTD  "cocaine addiction"          Home Medications    Prior to Admission medications   Medication Sig Start Date End Date Taking? Authorizing Provider  metroNIDAZOLE  (FLAGYL ) 500 MG tablet Take 500 mg by mouth 2 (two) times daily. 08/21/23  Yes [provider]  Prenatal Vit-Fe Sulfate-FA-DHA (PRENATAL VITAMIN/MIN +DHA PO) Take by mouth.   Yes [provider]  valACYclovir (VALTREX) 500 MG tablet Take 500 mg by mouth daily. Daily.   Yes [provider]  Blood Pressure Monitoring (BLOOD PRESSURE KIT) DEVI 1 kit by Does not apply route once a week. 02/16/21   Tresia Fruit, MD  ondansetron   (ZOFRAN -ODT) 8 MG disintegrating tablet Take 1 tablet (8 mg total) by mouth every 8 (eight) hours as needed for nausea or vomiting. 09/03/23   Felipe Horton, [place] , CNM    Family History History reviewed. No pertinent family history.  Social History Social History   Tobacco Use   Smoking status: Former    Current packs/day: 0.00    Types: Cigarettes    Quit date: 2021    Years since quitting: 4.3   Smokeless tobacco: Never  Vaping Use   Vaping status: Former   Substances: Nicotine , Flavoring  Substance Use Topics   Alcohol use: Not Currently    Alcohol/week: 20.0 standard drinks of alcohol    Types: 20 Cans of beer per week    Comment: Last drink in 2021   Drug use: Not Currently    Types: "Crack" cocaine    Comment: Last used in 2021     Allergies   Banana and Penicillins   Review of Systems Review of Systems  Constitutional:  Negative for chills and fever.  Eyes:  Negative for discharge and redness.  Respiratory:  Negative for shortness of breath.  Gastrointestinal:  Negative for abdominal pain, nausea and vomiting.  Genitourinary:  Positive for vaginal discharge.     Physical Exam Triage Vital Signs ED Triage Vitals  Encounter Vitals Group     BP 09/08/23 1442 99/63     Systolic BP Percentile --      Diastolic BP Percentile --      Pulse Rate 09/08/23 1442 71     Resp 09/08/23 1442 18     Temp 09/08/23 1442 98.5 F (36.9 C)     Temp Source 09/08/23 1442 Oral     SpO2 09/08/23 1442 99 %     Weight 09/08/23 1439 145 lb (65.8 kg)     Height 09/08/23 1439 5\' 7"  (1.702 m)     Head Circumference --      Peak Flow --      Pain Score 09/08/23 1437 0     Pain Loc --      Pain Education --      Exclude from Growth Chart --    No data found.  Updated Vital Signs BP 99/63 (BP Location: Left Arm)   Pulse 71   Temp 98.5 F (36.9 C) (Oral)   Resp 18   Ht 5\' 7"  (1.702 m)   Wt 145 lb (65.8 kg)   SpO2 99%   BMI 22.71 kg/m   Visual Acuity Right Eye  Distance:   Left Eye Distance:   Bilateral Distance:    Right Eye Near:   Left Eye Near:    Bilateral Near:     Physical Exam Vitals and nursing note reviewed.  Constitutional:      General: She is not in acute distress.    Appearance: Normal appearance. She is not ill-appearing.  HENT:     Head: Normocephalic and atraumatic.  Eyes:     Conjunctiva/sclera: Conjunctivae normal.  Cardiovascular:     Rate and Rhythm: Normal rate.  Pulmonary:     Effort: Pulmonary effort is normal. No respiratory distress.  Neurological:     Mental Status: She is alert.  Psychiatric:        Mood and Affect: Mood normal.        Behavior: Behavior normal.        Thought Content: Thought content normal.      UC Treatments / Results  Labs (all labs ordered are listed, but only abnormal results are displayed) Labs Reviewed  CERVICOVAGINAL ANCILLARY ONLY    EKG   Radiology No results found.  Procedures Procedures (including critical care time)  Medications Ordered in UC Medications - No data to display  Initial Impression / Assessment and Plan / UC Course  I have reviewed the triage vital signs and the nursing notes.  Pertinent labs & imaging results that were available during my care of the patient were reviewed by me and considered in my medical decision making (see chart for details).    Discussed that we would await results for further recommendation regarding treatment.  Patient is frustrated that she does not have results today but explained that our tests are send out and should she want wet prep results she could follow-up with her OB sooner.  Patient expressed understanding.  Final Clinical Impressions(s) / UC Diagnoses   Final diagnoses:  Vaginal discharge   Discharge Instructions   None    ED Prescriptions   None    PDMP not reviewed this encounter.   Vernestine Gondola, PA-C 09/08/23 1725

## 2023-09-09 ENCOUNTER — Ambulatory Visit (HOSPITAL_COMMUNITY): Payer: Self-pay

## 2023-09-09 LAB — CERVICOVAGINAL ANCILLARY ONLY
Bacterial Vaginitis (gardnerella): POSITIVE — AB
Candida Glabrata: NEGATIVE
Candida Vaginitis: NEGATIVE
Chlamydia: NEGATIVE
Comment: NEGATIVE
Comment: NEGATIVE
Comment: NEGATIVE
Comment: NEGATIVE
Comment: NEGATIVE
Comment: NORMAL
Neisseria Gonorrhea: NEGATIVE
Trichomonas: NEGATIVE

## 2023-09-09 MED ORDER — METRONIDAZOLE 0.75 % VA GEL: 1.0000 | Freq: Every day | VAGINAL | 0 refills | Status: AC

## 2024-01-04 ENCOUNTER — Other Ambulatory Visit: Payer: Self-pay

## 2024-01-04 ENCOUNTER — Inpatient Hospital Stay (HOSPITAL_COMMUNITY)
Admission: AD | Admit: 2024-01-04 | Discharge: 2024-01-04 | Disposition: A | Discharge status: Home / Self Care | Attending: Family Medicine | Admitting: Family Medicine

## 2024-01-04 ENCOUNTER — Encounter (HOSPITAL_COMMUNITY): Payer: Self-pay | Admitting: Family Medicine

## 2024-01-04 DIAGNOSIS — Z3A30 30 weeks gestation of pregnancy: Secondary | ICD-10-CM | POA: Insufficient documentation

## 2024-01-04 DIAGNOSIS — O99713 Diseases of the skin and subcutaneous tissue complicating pregnancy, third trimester: Secondary | ICD-10-CM | POA: Insufficient documentation

## 2024-01-04 DIAGNOSIS — L0291 Cutaneous abscess, unspecified: Secondary | ICD-10-CM | POA: Diagnosis not present

## 2024-01-04 DIAGNOSIS — L02214 Cutaneous abscess of groin: Secondary | ICD-10-CM | POA: Insufficient documentation

## 2024-01-04 DIAGNOSIS — L731 Pseudofolliculitis barbae: Secondary | ICD-10-CM | POA: Insufficient documentation

## 2024-01-04 MED ORDER — LIDOCAINE HCL (PF) 2 % IJ SOLN
10.0000 mL | Freq: Once | INTRAMUSCULAR | Status: DC
  Filled 2024-01-04: qty 10

## 2024-01-04 MED ORDER — LIDOCAINE HCL (PF) 2 % IJ SOLN
10.0000 mL | Freq: Once | INTRAMUSCULAR | Status: AC
Start: 2024-01-04 — End: 2024-01-04
  Administered 2024-01-04: 10 mL via INTRADERMAL
  Filled 2024-01-04: qty 10

## 2024-01-04 MED ORDER — CEFADROXIL 500 MG PO CAPS: 500.0000 mg | ORAL_CAPSULE | Freq: Two times a day (BID) | ORAL | 0 refills | Status: DC

## 2024-01-04 NOTE — MAU Note (Signed)
 Deborah Fischer is a 28 y.o. at [redacted]w[redacted]d here in MAU reporting: Patient reports that she went to urgent care this morning for an ingrown hair. She was told to come here because it is infected. Patient reports she gets ingrown's a lot and has a technique she uses to keep it clean. She was surprised to hear that it got infected and that the dr was concerned about it entering her vaginal wall given her pregnancy status.   She uses a boil cream that gave her BV and is now taking the metronidazole  to treat   Onset of complaint: last week  Pain score: 8/10 There were no vitals filed for this visit.    Lab orders placed from triage:  none

## 2024-01-04 NOTE — Discharge Instructions (Signed)
 Incision and Drainage, Care After After incision and drainage, it is common to have: Pain or discomfort around the incision site. Blood, fluid, or pus (drainage) from the incision. Redness and firm skin around the incision site. Follow these instructions at home: Take Tylenol  1000 mg every 6 hours if needed for pain. You can ice the area (ice or an ice pack in a towel), or use hot compresses. Leave the bandage on for 24 hours. If the dressing is dry or stuck when you try to remove it, moisten or wet it with saline or water. This will help you remove it without harming your skin or tissues. Check your wound every day for signs of infection. Check for: More redness, swelling, or pain. More fluid or blood. Warmth. Pus or a bad smell. If you were prescribed antibiotics, take them as told by your provider. Do not stop using the antibiotic even if you start to feel better. Do not apply creams, ointments, or liquids unless you have been told to by your provider. Activity Rest the affected area. Return to your normal activities as told by your provider. Ask your provider what activities are safe for you. General instructions Do not use any products that contain nicotine  or tobacco. These products include cigarettes, chewing tobacco, and vaping devices, such as e-cigarettes. These can delay incision healing after surgery. If you need help quitting, ask your provider. Do not take baths, swim, or use a hot tub until your provider approves. A shower and letting water and soap run over the area is ok. You may have some clear or slightly bloody drainage. The amount of drainage should go down each day. Your health care provider may give you more instructions. Make sure you know what you can and cannot do Contact a health care provider if: Your cyst or abscess comes back. You have any signs of infection. You notice red streaks that spread away from the incision site. You have a fever or chills. Get  help right away if: You have severe pain or bleeding. You become short of breath. You have chest pain. You have signs of a severe infection. You may notice changes in your incision area, such as: Swelling that makes the skin feel hard. Numbness or tingling. Sudden increase in redness. Your skin color may change from red to purple, and then to dark spots. Blisters, ulcers, or splitting of the skin. These symptoms may be an emergency. Get help right away. Call 911. Do not wait to see if the symptoms will go away. Do not drive yourself to the hospital. This information is not intended to replace advice given to you by your health care provider. Make sure you discuss any questions you have with your health care provider.

## 2024-01-04 NOTE — MAU Provider Note (Signed)
 Chief Complaint:  Recurrent Skin Infections (Ingrown hair )   HPI   None     Deborah Fischer is a 28 y.o. [id number] at [redacted]w[redacted]d who presents to maternity admissions reporting ingrown hair. She was evaluated at urgent care this morning and was told to come to MAU because it was infected and the provider was worried it could enter the vaginal wall. The spot started irritating her about 10 days ago and has grown in size. It is tender to the touch and makes walking painful.   Pregnancy Course: Receives care at [facility]. Prenatal records reviewed.   Past Medical History:  Diagnosis Date   Cocaine abuse (HCC)    Depression    in the past, doing well now   Manic depression (HCC)    UTI (urinary tract infection)    OB History  Gravida Para Term Preterm AB Living  3 1  1 1 1   SAB IAB Ectopic Multiple Live Births  1    1    # Outcome Date GA Lbr Len/2nd Weight Sex Type Anes PTL Lv  3 Current           2 Preterm 09/11/20 [redacted]w[redacted]d   M Vag-Spont   LIV  1 SAB             Obstetric Comments  PTD  cocaine addiction   Past Surgical History:  Procedure Laterality Date   WISDOM TOOTH EXTRACTION     History reviewed. No pertinent family history. Social History   Tobacco Use   Smoking status: Former    Current packs/day: 0.00    Types: Cigarettes    Quit date: 2021    Years since quitting: 4.7   Smokeless tobacco: Never  Vaping Use   Vaping status: Former   Substances: Nicotine , Flavoring  Substance Use Topics   Alcohol use: Not Currently    Alcohol/week: 20.0 standard drinks of alcohol    Types: 20 Cans of beer per week    Comment: Last drink in 2021   Drug use: Not Currently    Types: Crack cocaine    Comment: Last used in 2021   Allergies  Allergen Reactions   Banana Anaphylaxis and Swelling   Penicillins Rash    Has patient had a PCN reaction causing immediate rash, facial/tongue/throat swelling, SOB or lightheadedness with hypotension: NO Has patient had a PCN  reaction causing severe rash involving mucus membranes or skin necrosis: NO Has patient had a PCN reaction that required hospitalization: NO Has patient had a PCN reaction occurring within the last 10 years: Unknown If all of the above answers are NO, then may proceed with Cephalosporin use.    Medications Prior to Admission  Medication Sig Dispense Refill Last Dose/Taking   metroNIDAZOLE  (FLAGYL ) 500 MG tablet Take 500 mg by mouth 2 (two) times daily.   01/04/2024   Prenatal Vit-Fe Sulfate-FA-DHA (PRENATAL VITAMIN/MIN +DHA PO) Take by mouth.   01/04/2024   valACYclovir (VALTREX) 500 MG tablet Take 500 mg by mouth daily. Daily.   01/04/2024   Blood Pressure Monitoring (BLOOD PRESSURE KIT) DEVI 1 kit by Does not apply route once a week. 1 each 0 Unknown   ondansetron  (ZOFRAN -ODT) 8 MG disintegrating tablet Take 1 tablet (8 mg total) by mouth every 8 (eight) hours as needed for nausea or vomiting. 30 tablet 1 More than a month    I have reviewed patient's Past Medical Hx, Surgical Hx, Family Hx, Social Hx, medications and allergies.   ROS  Pertinent items noted in HPI and remainder of comprehensive ROS otherwise negative.   PHYSICAL EXAM  Patient Vitals for the past 24 hrs:  BP Temp Pulse Resp SpO2 Weight  01/04/24 1209 113/70 -- 88 -- -- --  01/04/24 1157 109/60 -- 80 -- -- --  01/04/24 1137 100/63 98.5 F (36.9 C) 83 16 100 % --  01/04/24 1131 -- -- -- -- -- 79.5 kg    Constitutional: Well-developed, well-nourished female in no acute distress.  HEENT: atraumatic, normocephalic. Neck has normal ROM. EOM intact. Cardiovascular: normal rate & rhythm, warm and well-perfused Respiratory: normal effort, no problems with respiration noted GI: Abd soft, non-tender, non-distended MSK: Extremities nontender, no edema, normal ROM Skin: warm and dry. Acyanotic, no jaundice or pallor. Neurologic: Alert and oriented x 4. No abnormal coordination. Psychiatric: Normal mood. Speech not slurred,  not rapid/pressured. Patient is cooperative. GU: no CVA tenderness Pelvic exam: VULVA: normal appearing vulva with no masses, tenderness or lesions, 4 x 3 cm tender fluctuant abscess in left inguinal area, no erythema or warmth, no lymphadenopathy, exam chaperoned by Corean Alas RN.   Fetal Tracing: Baseline FHR: 140 per minute Fetal heart variability: moderate Fetal Heart Rate accelerations: yes Fetal Heart Rate decelerations: none Fetal Non-stress Test: Category I (reactive) Toco: no uterine contractions   Diagnosis: abscess - Location: left inguinal area Procedure: Incision & drainage Informed consent:  Discussed risks (permanent loss of nail, infection, pain, bleeding, bruising, numbness, and recurrence of the condition) and benefits of the procedure, as well as the alternatives.  Informed consent was obtained. Anesthesia: 2 mL lidocaine  2% Type: total The area was prepared and draped in a standard fashion. A large amount of fluid was drained. Sterile pressure dressing applied. Sample collected for culture. The patient tolerated the procedure well. The patient was instructed on post-op care.  MDM & MAU COURSE  MDM: Moderate  MAU Course: -Vital signs within normal limits. Afebrile. -Physical exam consistent with abscess. Discussed I&D with antibiotics at discharge, patient agreeable. -I&D performed, culture sent. -Abscess small enough that packing is not needed. -Rx for cefadroxil , patient has used cephalosporin (Rocephin ) previously without issue.   Orders Placed This Encounter  Procedures   Aerobic/Anaerobic Culture w Gram Stain (surgical/deep wound)   Discharge patient   Meds ordered this encounter  Medications   DISCONTD: lidocaine  HCl (PF) (XYLOCAINE ) 2 % injection 10 mL   cefadroxil  (DURICEF) 500 MG capsule    Sig: Take 1 capsule (500 mg total) by mouth 2 (two) times daily.    Dispense:  14 capsule    Refill:  0   lidocaine  HCl (PF) (XYLOCAINE ) 2 %  injection 10 mL    ASSESSMENT   1. Abscess   2. Ingrown hair   3. [redacted] weeks gestation of pregnancy     PLAN  Discharge home in stable condition with return precautions.  Cefadroxil  500 mg BID x 7 days for abscess/soft tissue infection.     Allergies as of 01/04/2024       Reactions   Banana Anaphylaxis, Swelling   Penicillins Rash   Has patient had a PCN reaction causing immediate rash, facial/tongue/throat swelling, SOB or lightheadedness with hypotension: NO Has patient had a PCN reaction causing severe rash involving mucus membranes or skin necrosis: NO Has patient had a PCN reaction that required hospitalization: NO Has patient had a PCN reaction occurring within the last 10 years: Unknown If all of the above answers are NO, then may proceed with Cephalosporin use.  Medication List     TAKE these medications    Blood Pressure Kit Devi 1 kit by Does not apply route once a week.   cefadroxil  500 MG capsule Commonly known as: DURICEF Take 1 capsule (500 mg total) by mouth 2 (two) times daily.   metroNIDAZOLE  500 MG tablet Commonly known as: FLAGYL  Take 500 mg by mouth 2 (two) times daily.   ondansetron  8 MG disintegrating tablet Commonly known as: ZOFRAN -ODT Take 1 tablet (8 mg total) by mouth every 8 (eight) hours as needed for nausea or vomiting.   PRENATAL VITAMIN/MIN +DHA PO Take by mouth.   valACYclovir 500 MG tablet Commonly known as: VALTREX Take 500 mg by mouth daily. Daily.         Joesph DELENA Sear, PA

## 2024-01-09 LAB — AEROBIC/ANAEROBIC CULTURE W GRAM STAIN (SURGICAL/DEEP WOUND): Culture: NO GROWTH

## 2024-02-04 ENCOUNTER — Encounter (HOSPITAL_COMMUNITY): Payer: Self-pay | Admitting: Obstetrics and Gynecology

## 2024-02-04 ENCOUNTER — Inpatient Hospital Stay (HOSPITAL_COMMUNITY)
Admission: AD | Admit: 2024-02-04 | Discharge: 2024-02-04 | Disposition: A | Discharge status: Home / Self Care | Attending: Obstetrics and Gynecology | Admitting: Obstetrics and Gynecology

## 2024-02-04 ENCOUNTER — Other Ambulatory Visit: Payer: Self-pay

## 2024-02-04 DIAGNOSIS — O23593 Infection of other part of genital tract in pregnancy, third trimester: Secondary | ICD-10-CM

## 2024-02-04 DIAGNOSIS — A6000 Herpesviral infection of urogenital system, unspecified: Secondary | ICD-10-CM | POA: Diagnosis not present

## 2024-02-04 DIAGNOSIS — B3731 Acute candidiasis of vulva and vagina: Secondary | ICD-10-CM

## 2024-02-04 DIAGNOSIS — O23599 Infection of other part of genital tract in pregnancy, unspecified trimester: Secondary | ICD-10-CM

## 2024-02-04 DIAGNOSIS — Z3A34 34 weeks gestation of pregnancy: Secondary | ICD-10-CM

## 2024-02-04 DIAGNOSIS — O98813 Other maternal infectious and parasitic diseases complicating pregnancy, third trimester: Secondary | ICD-10-CM | POA: Diagnosis present

## 2024-02-04 DIAGNOSIS — O98513 Other viral diseases complicating pregnancy, third trimester: Secondary | ICD-10-CM | POA: Insufficient documentation

## 2024-02-04 LAB — WET PREP, GENITAL
Clue Cells Wet Prep HPF POC: NONE SEEN
Sperm: NONE SEEN
Trich, Wet Prep: NONE SEEN
WBC, Wet Prep HPF POC: <10 (ref <10)
Yeast Wet Prep HPF POC: NONE SEEN

## 2024-02-04 LAB — URINALYSIS, ROUTINE W REFLEX MICROSCOPIC
Bilirubin Urine: NEGATIVE
Glucose, UA: NEGATIVE mg/dL
Hgb urine dipstick: NEGATIVE
Ketones, ur: NEGATIVE mg/dL
Leukocytes,Ua: NEGATIVE
Nitrite: NEGATIVE
Protein, ur: NEGATIVE mg/dL
Specific Gravity, Urine: 1.008 (ref 1.005–1.030)
pH: 6 (ref 5.0–8.0)

## 2024-02-04 MED ORDER — FLUCONAZOLE 150 MG PO TABS: 150.0000 mg | ORAL_TABLET | Freq: Once | ORAL | 1 refills | Status: AC

## 2024-02-04 NOTE — Discharge Instructions (Signed)
 Your culture was positive for vulvovaginal candidiasis (yeast infection). This is not a sexually transmitted infection, however abstaining from sex or using condoms may prevent recurrence. It is caused by a change in the acid level in the vagina, which is very common during pregnancy. Do not douche as this is associated with decreased cure rates and more bacterial overgrowths.   I have sent a prescription for Diflucan to your pharmacy. If you are still having symptoms after 72 hours, you can call the pharmacy to pick up the refill.  Reasons to return to MAU at [facility]: Less than 36 weeks: Contractions feels like menstrual cramps. You should go to the hospital if you have more than 6 contractions in an hour, even after you have rested and drank at least 16 ounces of water.  More than 36 weeks: You begin to have strong, frequent contractions 5 minutes apart or less, each last 1 minute, these have been going on for 1-2 hours, and you cannot walk or talk during them. Your water breaks.  Sometimes it is a big gush of fluid. However, many times it may it may be much more subtle. You should go to the hospital if you have a constant leakage of fluid from your vagina, enough to soak a pad when you are walking around.  You have vaginal bleeding.  It is normal to have a small amount of spotting if your cervix was checked. If you have bleeding requiring the use of a pad, go to the hospital. You don't feel your baby moving like normal.  If you think that you baby's movement is decreased, eat a snack and rest on your left side in a quiet room for one hour. If you have not felt the baby move more than 6 times in an hour GO TO THE HOSPITAL.

## 2024-02-04 NOTE — MAU Provider Note (Signed)
 Chief Complaint:  Vaginal Itching   HPI   None     Deborah Fischer is a 28 y.o. [id number] at [redacted]w[redacted]d who presents to maternity admissions reporting vaginal itching and burning.  She reports unprotected intercourse 3 days ago.  She reports an increase in vaginal discharge yesterday.  Discharge described as fishy, white, and thin.  She has a history of recurrent BV but states that this discharge feels different.  She has a history of genital HSV.  Last HSV outbreak was over a year ago, and she has been on Valtrex throughout this pregnancy.  Denies abdominal pain, fever, chills.  Endorses good fetal movement.  Pregnancy Course: Receives care at [facility]. Prenatal records reviewed.  Pregnancy complicated by history of genital HSV, subchorionic hematoma noted on ultrasound in the first trimester.  Past Medical History:  Diagnosis Date   Depression    in the past, doing well now   Manic depression (HCC)    UTI (urinary tract infection)    OB History  Gravida Para Term Preterm AB Living  3 1  1 1 1   SAB IAB Ectopic Multiple Live Births  1    1    # Outcome Date GA Lbr Len/2nd Weight Sex Type Anes PTL Lv  3 Current           2 Preterm 09/11/20 [redacted]w[redacted]d   M Vag-Spont   LIV  1 SAB             Obstetric Comments  PTD  cocaine addiction   Past Surgical History:  Procedure Laterality Date   WISDOM TOOTH EXTRACTION     No family history on file. Social History   Tobacco Use   Smoking status: Former    Current packs/day: 0.00    Types: Cigarettes    Quit date: 2021    Years since quitting: 4.7   Smokeless tobacco: Never  Vaping Use   Vaping status: Former   Substances: Nicotine , Flavoring  Substance Use Topics   Alcohol use: Not Currently    Alcohol/week: 20.0 standard drinks of alcohol    Types: 20 Cans of beer per week    Comment: Last drink in 2021   Drug use: Not Currently    Types: Crack cocaine    Comment: Last used in 2021   Allergies  Allergen Reactions   Banana  Anaphylaxis and Swelling   Penicillins Rash    Has patient had a PCN reaction causing immediate rash, facial/tongue/throat swelling, SOB or lightheadedness with hypotension: NO Has patient had a PCN reaction causing severe rash involving mucus membranes or skin necrosis: NO Has patient had a PCN reaction that required hospitalization: NO Has patient had a PCN reaction occurring within the last 10 years: Unknown If all of the above answers are NO, then may proceed with Cephalosporin use.    Medications Prior to Admission  Medication Sig Dispense Refill Last Dose/Taking   Prenatal Vit-Fe Sulfate-FA-DHA (PRENATAL VITAMIN/MIN +DHA PO) Take by mouth.   02/04/2024   valACYclovir (VALTREX) 500 MG tablet Take 500 mg by mouth daily. Daily.   02/04/2024   Blood Pressure Monitoring (BLOOD PRESSURE KIT) DEVI 1 kit by Does not apply route once a week. 1 each 0    ondansetron  (ZOFRAN -ODT) 8 MG disintegrating tablet Take 1 tablet (8 mg total) by mouth every 8 (eight) hours as needed for nausea or vomiting. 30 tablet 1     I have reviewed patient's Past Medical Hx, Surgical Hx, Family Hx, Social  Hx, medications and allergies.   ROS  Pertinent items noted in HPI and remainder of comprehensive ROS otherwise negative.   PHYSICAL EXAM  Patient Vitals for the past 24 hrs:  BP Temp Temp src Pulse Resp SpO2 Height Weight  02/04/24 1519 117/71 98.5 F (36.9 C) Oral 88 16 98 % 5' 7 (1.702 m) 82.5 kg    Constitutional: Well-developed, well-nourished female in no acute distress.  HEENT: atraumatic, normocephalic. Neck has normal ROM. EOM intact. Cardiovascular: normal rate & rhythm, warm and well-perfused Respiratory: normal effort, no problems with respiration noted GI: Abd soft, non-tender, non-distended MSK: Extremities nontender, no edema, normal ROM Skin: warm and dry. Acyanotic, no jaundice or pallor. Neurologic: Alert and oriented x 4. No abnormal coordination. Psychiatric: Normal mood. Speech  not slurred, not rapid/pressured. Patient is cooperative. GU: no CVA tenderness Pelvic exam: VULVA: normal appearing vulva with no masses, tenderness or lesions, VAGINA: white creamy discharge, no lesions, exam chaperoned by Josette Pollack RN.   Fetal Tracing: Baseline FHR: 145 per minute Fetal heart variability: moderate Fetal Heart Rate accelerations: yes Fetal Heart Rate decelerations: one variable Fetal Non-stress Test: reactive Toco: no uterine contractions   Labs: Results for orders placed or performed during the hospital encounter of 02/04/24 (from the past 24 hours)  Wet prep, genital     Status: None   Collection Time: 02/04/24  4:04 PM  Result Value Ref Range   Yeast Wet Prep HPF POC NONE SEEN NONE SEEN   Trich, Wet Prep NONE SEEN NONE SEEN   Clue Cells Wet Prep HPF POC NONE SEEN NONE SEEN   WBC, Wet Prep HPF POC <10 <10   Sperm NONE SEEN     Imaging:  No results found.  MDM & MAU COURSE  MDM: Moderate  MAU Course: -Vital signs within normal limits. -No evidence of HSV outbreak on exam, continue Valtrex daily. -Wet prep negative, but will treat vulvovaginal candidiasis based on clinical diagnosis.  Orders Placed This Encounter  Procedures   Wet prep, genital   Urinalysis, Routine w reflex microscopic -Urine, Clean Catch   External Fetal Monitoring for all patients >/= 23 weeks   Assess fetal heart tones for all patients >/= 12 weeks   Discharge patient   Meds ordered this encounter  Medications   fluconazole (DIFLUCAN) 150 MG tablet    Sig: Take 1 tablet (150 mg total) by mouth once for 1 dose. If symptoms not resolved after 72 hours, can request refill from pharmacy and repeat dose.    Dispense:  1 tablet    Refill:  1    ASSESSMENT   1. Candidal vulvovaginitis during pregnancy   2. [redacted] weeks gestation of pregnancy     PLAN  Discharge home in stable condition with return precautions.  Diflucan for vulvovaginal candidiasis.      Allergies as of  02/04/2024       Reactions   Banana Anaphylaxis, Swelling   Penicillins Rash   Has patient had a PCN reaction causing immediate rash, facial/tongue/throat swelling, SOB or lightheadedness with hypotension: NO Has patient had a PCN reaction causing severe rash involving mucus membranes or skin necrosis: NO Has patient had a PCN reaction that required hospitalization: NO Has patient had a PCN reaction occurring within the last 10 years: Unknown If all of the above answers are NO, then may proceed with Cephalosporin use.        Medication List     TAKE these medications    Blood  Pressure Kit Devi 1 kit by Does not apply route once a week.   fluconazole 150 MG tablet Commonly known as: Diflucan Take 1 tablet (150 mg total) by mouth once for 1 dose. If symptoms not resolved after 72 hours, can request refill from pharmacy and repeat dose.   ondansetron  8 MG disintegrating tablet Commonly known as: ZOFRAN -ODT Take 1 tablet (8 mg total) by mouth every 8 (eight) hours as needed for nausea or vomiting.   PRENATAL VITAMIN/MIN +DHA PO Take by mouth.   valACYclovir 500 MG tablet Commonly known as: VALTREX Take 500 mg by mouth daily. Daily.        Joesph DELENA Sear, PA

## 2024-02-04 NOTE — MAU Note (Signed)
 27yo G3P1 presents with vaginal itching and burning. Noticed increasing discharge yesterday with increased itching and burning starting today. Has not been sexually active this pregnancy but had unprotected sex three nights ago. Has had history of recurrent BV but states this feels different. Over a year since last HSV outbreak, has been on valtrex throughout this pregnancy. Describes discharge as fishy, white, thin mucus and has to wear liner due to increased amount. Denies systemic symptoms such as fever, chill, headache. Has not taken any meds. +FM, denies bleeding and contractions. Subchorionic hemorrhage early this pregnancy but otherwise uncomplicated.

## 2024-02-05 LAB — HEPATITIS C ANTIBODY: HCV Ab: NEGATIVE

## 2024-02-05 LAB — GC/CHLAMYDIA PROBE AMP (~~LOC~~) NOT AT ARMC
Chlamydia: NEGATIVE
Comment: NEGATIVE
Comment: NORMAL
Neisseria Gonorrhea: NEGATIVE

## 2024-02-05 LAB — OB RESULTS CONSOLE RPR: RPR: NONREACTIVE

## 2024-02-05 LAB — OB RESULTS CONSOLE RUBELLA ANTIBODY, IGM: Rubella: NON-IMMUNE/NOT IMMUNE

## 2024-02-05 LAB — OB RESULTS CONSOLE HEPATITIS B SURFACE ANTIGEN: Hepatitis B Surface Ag: NEGATIVE

## 2024-02-05 LAB — OB RESULTS CONSOLE HIV ANTIBODY (ROUTINE TESTING): HIV: NONREACTIVE

## 2024-02-26 ENCOUNTER — Other Ambulatory Visit: Payer: Self-pay | Admitting: Obstetrics and Gynecology

## 2024-02-26 DIAGNOSIS — Z3490 Encounter for supervision of normal pregnancy, unspecified, unspecified trimester: Secondary | ICD-10-CM

## 2024-03-01 ENCOUNTER — Encounter: Admitting: Obstetrics and Gynecology

## 2024-03-04 ENCOUNTER — Inpatient Hospital Stay (HOSPITAL_COMMUNITY): Admitting: Anesthesiology

## 2024-03-04 ENCOUNTER — Other Ambulatory Visit: Payer: Self-pay

## 2024-03-04 ENCOUNTER — Inpatient Hospital Stay (HOSPITAL_COMMUNITY)
Admission: AD | Admit: 2024-03-04 | Discharge: 2024-03-07 | DRG: 807 | Disposition: A | Discharge status: Home / Self Care | Attending: Obstetrics and Gynecology | Admitting: Obstetrics and Gynecology

## 2024-03-04 ENCOUNTER — Encounter (HOSPITAL_COMMUNITY): Payer: Self-pay | Admitting: Obstetrics and Gynecology

## 2024-03-04 DIAGNOSIS — Z3490 Encounter for supervision of normal pregnancy, unspecified, unspecified trimester: Secondary | ICD-10-CM | POA: Diagnosis present

## 2024-03-04 DIAGNOSIS — A6000 Herpesviral infection of urogenital system, unspecified: Secondary | ICD-10-CM | POA: Diagnosis present

## 2024-03-04 DIAGNOSIS — O9832 Other infections with a predominantly sexual mode of transmission complicating childbirth: Principal | ICD-10-CM | POA: Diagnosis present

## 2024-03-04 DIAGNOSIS — Z87891 Personal history of nicotine dependence: Secondary | ICD-10-CM | POA: Diagnosis not present

## 2024-03-04 DIAGNOSIS — O26893 Other specified pregnancy related conditions, third trimester: Secondary | ICD-10-CM | POA: Diagnosis present

## 2024-03-04 DIAGNOSIS — Z3A39 39 weeks gestation of pregnancy: Secondary | ICD-10-CM

## 2024-03-04 LAB — CBC
HCT: 34.1 % — ABNORMAL LOW (ref 36.0–46.0)
Hemoglobin: 11.4 g/dL — ABNORMAL LOW (ref 12.0–15.0)
MCH: 28.9 pg (ref 26.0–34.0)
MCHC: 33.4 g/dL (ref 30.0–36.0)
MCV: 86.3 fL (ref 80.0–100.0)
Platelets: 239 K/uL (ref 150–400)
RBC: 3.95 MIL/uL (ref 3.87–5.11)
RDW: 11.5 % (ref 11.5–15.5)
WBC: 8.6 K/uL (ref 4.0–10.5)
nRBC: 0 % (ref 0.0–0.2)

## 2024-03-04 LAB — TYPE AND SCREEN
ABO/RH(D): A POS
Antibody Screen: NEGATIVE

## 2024-03-04 LAB — OB RESULTS CONSOLE GBS: GBS: NEGATIVE

## 2024-03-04 MED ORDER — LACTATED RINGERS IV SOLN: 500.0000 mL | Freq: Once | INTRAVENOUS | Status: DC

## 2024-03-04 MED ORDER — OXYTOCIN BOLUS FROM INFUSION
333.0000 mL | Freq: Once | INTRAVENOUS | Status: DC
Start: 2024-03-04 — End: 2024-03-04

## 2024-03-04 MED ORDER — EPHEDRINE 5 MG/ML INJ: 10.0000 mg | INTRAVENOUS | Status: DC | PRN

## 2024-03-04 MED ORDER — OXYCODONE-ACETAMINOPHEN 5-325 MG PO TABS: 1.0000 | ORAL_TABLET | ORAL | Status: DC | PRN

## 2024-03-04 MED ORDER — FENTANYL CITRATE (PF) 100 MCG/2ML IJ SOLN: 50.0000 ug | INTRAMUSCULAR | Status: DC | PRN

## 2024-03-04 MED ORDER — EPHEDRINE 5 MG/ML INJ
10.0000 mg | INTRAVENOUS | Status: DC | PRN
Start: 2024-03-04 — End: 2024-03-05
  Administered 2024-03-04: 10 mg via INTRAVENOUS
  Filled 2024-03-04: qty 5

## 2024-03-04 MED ORDER — LACTATED RINGERS IV SOLN
500.0000 mL | Freq: Once | INTRAVENOUS | Status: AC
  Administered 2024-03-04: 500 mL via INTRAVENOUS

## 2024-03-04 MED ORDER — TERBUTALINE SULFATE 1 MG/ML IJ SOLN: 0.2500 mg | Freq: Once | INTRAMUSCULAR | Status: DC | PRN

## 2024-03-04 MED ORDER — PHENYLEPHRINE 80 MCG/ML (10ML) SYRINGE FOR IV PUSH (FOR BLOOD PRESSURE SUPPORT)
80.0000 ug | PREFILLED_SYRINGE | INTRAVENOUS | Status: DC | PRN
  Administered 2024-03-04: 160 ug via INTRAVENOUS

## 2024-03-04 MED ORDER — SOD CITRATE-CITRIC ACID 500-334 MG/5ML PO SOLN
30.0000 mL | ORAL | Status: DC | PRN
Start: 2024-03-04 — End: 2024-03-05

## 2024-03-04 MED ORDER — PHENYLEPHRINE 80 MCG/ML (10ML) SYRINGE FOR IV PUSH (FOR BLOOD PRESSURE SUPPORT)
80.0000 ug | PREFILLED_SYRINGE | INTRAVENOUS | Status: DC | PRN
  Administered 2024-03-04: 80 ug via INTRAVENOUS
  Filled 2024-03-04: qty 10

## 2024-03-04 MED ORDER — LIDOCAINE HCL (PF) 1 % IJ SOLN: 30.0000 mL | INTRAMUSCULAR | Status: DC | PRN

## 2024-03-04 MED ORDER — FENTANYL-BUPIVACAINE-NACL 0.5-0.125-0.9 MG/250ML-% EP SOLN: 12.0000 mL/h | EPIDURAL | Status: DC | PRN

## 2024-03-04 MED ORDER — FLEET ENEMA RE ENEM
1.0000 | ENEMA | RECTAL | Status: DC | PRN
Start: 2024-03-04 — End: 2024-03-05

## 2024-03-04 MED ORDER — OXYTOCIN BOLUS FROM INFUSION
333.0000 mL | Freq: Once | INTRAVENOUS | Status: AC
  Administered 2024-03-05: 333 mL via INTRAVENOUS

## 2024-03-04 MED ORDER — FENTANYL-BUPIVACAINE-NACL 0.5-0.125-0.9 MG/250ML-% EP SOLN
12.0000 mL/h | EPIDURAL | Status: DC | PRN
  Administered 2024-03-04: 12 mL/h via EPIDURAL
  Filled 2024-03-04: qty 250

## 2024-03-04 MED ORDER — DIPHENHYDRAMINE HCL 50 MG/ML IJ SOLN: 12.5000 mg | INTRAMUSCULAR | Status: DC | PRN

## 2024-03-04 MED ORDER — LIDOCAINE HCL (PF) 1 % IJ SOLN
INTRAMUSCULAR | Status: DC | PRN
  Administered 2024-03-04: 8 mL via EPIDURAL

## 2024-03-04 MED ORDER — OXYTOCIN-SODIUM CHLORIDE 30-0.9 UT/500ML-% IV SOLN: 2.5000 [IU]/h | INTRAVENOUS | Status: DC

## 2024-03-04 MED ORDER — OXYTOCIN-SODIUM CHLORIDE 30-0.9 UT/500ML-% IV SOLN
2.5000 [IU]/h | INTRAVENOUS | Status: DC
  Filled 2024-03-04: qty 500

## 2024-03-04 MED ORDER — LACTATED RINGERS IV SOLN: INTRAVENOUS | Status: DC

## 2024-03-04 MED ORDER — OXYCODONE-ACETAMINOPHEN 5-325 MG PO TABS: 2.0000 | ORAL_TABLET | ORAL | Status: DC | PRN

## 2024-03-04 MED ORDER — ONDANSETRON HCL 4 MG/2ML IJ SOLN: 4.0000 mg | Freq: Four times a day (QID) | INTRAMUSCULAR | Status: DC | PRN

## 2024-03-04 MED ORDER — LACTATED RINGERS IV SOLN
500.0000 mL | INTRAVENOUS | Status: DC | PRN
  Administered 2024-03-04: 500 mL via INTRAVENOUS

## 2024-03-04 MED ORDER — ACETAMINOPHEN 325 MG PO TABS: 650.0000 mg | ORAL_TABLET | ORAL | Status: DC | PRN

## 2024-03-04 MED ORDER — PHENYLEPHRINE 80 MCG/ML (10ML) SYRINGE FOR IV PUSH (FOR BLOOD PRESSURE SUPPORT): 80.0000 ug | PREFILLED_SYRINGE | INTRAVENOUS | Status: DC | PRN

## 2024-03-04 MED ORDER — LACTATED RINGERS IV SOLN: 500.0000 mL | INTRAVENOUS | Status: DC | PRN

## 2024-03-04 MED ORDER — SOD CITRATE-CITRIC ACID 500-334 MG/5ML PO SOLN: 30.0000 mL | ORAL | Status: DC | PRN

## 2024-03-04 MED ORDER — MISOPROSTOL 25 MCG QUARTER TABLET: 25.0000 ug | ORAL_TABLET | ORAL | Status: DC | PRN

## 2024-03-04 MED ORDER — VALACYCLOVIR HCL 500 MG PO TABS
500.0000 mg | ORAL_TABLET | Freq: Two times a day (BID) | ORAL | Status: DC
  Administered 2024-03-04: 500 mg via ORAL
  Filled 2024-03-04: qty 1

## 2024-03-04 MED ORDER — ONDANSETRON HCL 4 MG/2ML IJ SOLN
4.0000 mg | Freq: Four times a day (QID) | INTRAMUSCULAR | Status: DC | PRN
  Administered 2024-03-04: 4 mg via INTRAVENOUS
  Filled 2024-03-04: qty 2

## 2024-03-04 NOTE — MAU Note (Signed)
 Deborah Fischer is a 28 y.o. at [redacted]w[redacted]d here in MAU reporting: CTX intensifying since 1600. No c/o of LOF, VB and reports +FM  LMP:  Onset of complaint: 160 Pain score: 7/1  FHT: By-passed triaged and placed in MAU exam room   Lab orders placed from triage:

## 2024-03-04 NOTE — Anesthesia Procedure Notes (Signed)
 Epidural Patient location during procedure: OB Start time: 03/04/2024 11:04 PM End time: 03/04/2024 11:09 PM  Staffing Anesthesiologist: Mallory Manus, MD  Preanesthetic Checklist Completed: patient identified, IV checked, site marked, risks and benefits discussed, surgical consent, monitors and equipment checked, pre-op evaluation and timeout performed  Epidural Patient position: sitting Prep: DuraPrep and site prepped and draped Patient monitoring: continuous pulse ox and blood pressure Approach: midline Location: L2-L3 Injection technique: LOR air  Needle:  Needle type: Tuohy  Needle gauge: 17 G Needle length: 9 cm and 9 Needle insertion depth: 6 cm Catheter type: closed end flexible Catheter size: 19 Gauge Catheter at skin depth: 12 cm Test dose: negative  Assessment Events: blood not aspirated, no cerebrospinal fluid, injection not painful, no injection resistance, no paresthesia and negative IV test

## 2024-03-04 NOTE — Anesthesia Preprocedure Evaluation (Signed)
 Anesthesia Evaluation  Patient identified by MRN, date of birth, ID band Patient awake    Reviewed: Allergy & Precautions, H&P , NPO status , Patient's Chart, lab work & pertinent test results, reviewed documented beta blocker date and time   Airway Mallampati: I  TM Distance: >3 FB Neck ROM: full    Dental no notable dental hx. (+) Teeth Intact, Dental Advisory Given   Pulmonary neg pulmonary ROS, former smoker   Pulmonary exam normal breath sounds clear to auscultation       Cardiovascular negative cardio ROS Normal cardiovascular exam Rhythm:regular Rate:Normal     Neuro/Psych  PSYCHIATRIC DISORDERS  Depression Bipolar Disorder   negative neurological ROS     GI/Hepatic negative GI ROS, Neg liver ROS,,,  Endo/Other  negative endocrine ROS    Renal/GU negative Renal ROS  negative genitourinary   Musculoskeletal   Abdominal   Peds  Hematology negative hematology ROS (+)   Anesthesia Other Findings   Reproductive/Obstetrics (+) Pregnancy                              Anesthesia Physical Anesthesia Plan  ASA: 2  Anesthesia Plan: Epidural   Post-op Pain Management: Minimal or no pain anticipated   Induction: Intravenous  PONV Risk Score and Plan: 2  Airway Management Planned:   Additional Equipment: Fetal Monitoring  Intra-op Plan:   Post-operative Plan:   Informed Consent: I have reviewed the patients History and Physical, chart, labs and discussed the procedure including the risks, benefits and alternatives for the proposed anesthesia with the patient or authorized representative who has indicated his/her understanding and acceptance.     Dental Advisory Given  Plan Discussed with: Anesthesiologist  Anesthesia Plan Comments: (Labs checked- platelets confirmed with RN in room. Fetal heart tracing, per RN, reported to be stable enough for sitting procedure. Discussed  epidural, and patient consents to the procedure:  included risk of possible headache,backache, failed block, allergic reaction, and nerve injury. This patient was asked if she had any questions or concerns before the procedure started.)        Anesthesia Quick Evaluation

## 2024-03-05 ENCOUNTER — Encounter (HOSPITAL_COMMUNITY): Payer: Self-pay | Admitting: Obstetrics and Gynecology

## 2024-03-05 LAB — RPR: RPR Ser Ql: NONREACTIVE

## 2024-03-05 MED ORDER — ZOLPIDEM TARTRATE 5 MG PO TABS: 5.0000 mg | ORAL_TABLET | Freq: Every evening | ORAL | Status: DC | PRN

## 2024-03-05 MED ORDER — DIBUCAINE (PERIANAL) 1 % EX OINT
1.0000 | TOPICAL_OINTMENT | CUTANEOUS | Status: DC | PRN
Start: 2024-03-05 — End: 2024-03-07

## 2024-03-05 MED ORDER — WITCH HAZEL-GLYCERIN EX PADS: 1.0000 | MEDICATED_PAD | CUTANEOUS | Status: DC | PRN

## 2024-03-05 MED ORDER — SIMETHICONE 80 MG PO CHEW: 80.0000 mg | CHEWABLE_TABLET | ORAL | Status: DC | PRN

## 2024-03-05 MED ORDER — TETANUS-DIPHTH-ACELL PERTUSSIS 5-2-15.5 LF-MCG/0.5 IM SUSP: 0.5000 mL | Freq: Once | INTRAMUSCULAR | Status: DC

## 2024-03-05 MED ORDER — BENZOCAINE-MENTHOL 20-0.5 % EX AERO: 1.0000 | INHALATION_SPRAY | CUTANEOUS | Status: DC | PRN

## 2024-03-05 MED ORDER — OXYTOCIN-SODIUM CHLORIDE 30-0.9 UT/500ML-% IV SOLN: 2.5000 [IU]/h | INTRAVENOUS | Status: DC | PRN

## 2024-03-05 MED ORDER — ONDANSETRON HCL 4 MG/2ML IJ SOLN: 4.0000 mg | INTRAMUSCULAR | Status: DC | PRN

## 2024-03-05 MED ORDER — IBUPROFEN 600 MG PO TABS
600.0000 mg | ORAL_TABLET | Freq: Four times a day (QID) | ORAL | Status: DC
  Administered 2024-03-05 – 2024-03-07 (×9): 600 mg via ORAL
  Filled 2024-03-05 (×9): qty 1

## 2024-03-05 MED ORDER — LACTATED RINGERS IV SOLN: INTRAVENOUS | Status: DC

## 2024-03-05 MED ORDER — METHYLERGONOVINE MALEATE 0.2 MG/ML IJ SOLN: 0.2000 mg | INTRAMUSCULAR | Status: DC | PRN

## 2024-03-05 MED ORDER — ACETAMINOPHEN 325 MG PO TABS
650.0000 mg | ORAL_TABLET | ORAL | Status: DC | PRN
  Administered 2024-03-05 (×2): 650 mg via ORAL
  Filled 2024-03-05 (×2): qty 2

## 2024-03-05 MED ORDER — OXYCODONE HCL 5 MG PO TABS: 10.0000 mg | ORAL_TABLET | ORAL | Status: DC | PRN

## 2024-03-05 MED ORDER — OXYCODONE HCL 5 MG PO TABS: 5.0000 mg | ORAL_TABLET | ORAL | Status: DC | PRN

## 2024-03-05 MED ORDER — METHYLERGONOVINE MALEATE 0.2 MG PO TABS: 0.2000 mg | ORAL_TABLET | ORAL | Status: DC | PRN

## 2024-03-05 MED ORDER — DIPHENHYDRAMINE HCL 25 MG PO CAPS: 25.0000 mg | ORAL_CAPSULE | Freq: Four times a day (QID) | ORAL | Status: DC | PRN

## 2024-03-05 MED ORDER — PRENATAL MULTIVITAMIN CH
1.0000 | ORAL_TABLET | Freq: Every day | ORAL | Status: DC
  Administered 2024-03-05 – 2024-03-07 (×3): 1 via ORAL
  Filled 2024-03-05 (×3): qty 1

## 2024-03-05 MED ORDER — SENNOSIDES-DOCUSATE SODIUM 8.6-50 MG PO TABS
2.0000 | ORAL_TABLET | Freq: Every day | ORAL | Status: DC
  Administered 2024-03-06 – 2024-03-07 (×2): 2 via ORAL
  Filled 2024-03-05 (×2): qty 2

## 2024-03-05 MED ORDER — COCONUT OIL OIL
1.0000 | TOPICAL_OIL | Status: DC | PRN
  Administered 2024-03-05: 1 via TOPICAL

## 2024-03-05 MED ORDER — ONDANSETRON HCL 4 MG PO TABS: 4.0000 mg | ORAL_TABLET | ORAL | Status: DC | PRN

## 2024-03-05 NOTE — H&P (Signed)
 Deborah Fischer is a 28 y.o. female presenting for labor  28 year old G3 P1-0-1-1 at 39+0 presents for painful contractions and was determined to be in labor.  Patient transferred care to [facility] at 35 weeks.  Her pregnancy has been overall uncomplicated.  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 OB History     Gravida  3   Para  1   Term      Preterm  1   AB  1   Living  1      SAB  1   IAB      Ectopic      Multiple      Live Births  1        Obstetric Comments  PTD  cocaine addiction        Past Medical History:  Diagnosis Date   Depression    in the past, doing well now   Manic depression (HCC)    UTI (urinary tract infection)    Past Surgical History:  Procedure Laterality Date   WISDOM TOOTH EXTRACTION     Family History: family history is not on file. Social History:  reports that she quit smoking about 4 years ago. Her smoking use included cigarettes. She has never used smokeless tobacco. She reports that she does not currently use alcohol after a past usage of about 20.0 standard drinks of alcohol per week. She reports that she does not currently use drugs after having used the following drugs: Crack cocaine.     Maternal Diabetes: No Genetic Screening: Normal Maternal Ultrasounds/Referrals: Normal Fetal Ultrasounds or other Referrals:  None Maternal Substance Abuse:  No Significant Maternal Medications:  None Significant Maternal Lab Results:  None Number of Prenatal Visits:greater than 3 verified prenatal visits Maternal Vaccinations: Declined Other Comments:  None  Review of Systems History Dilation: 6.5 Effacement (%): 80, 90 Station: -2 Exam by:: Lacinda Gamer, RN Blood pressure (!) 101/59, pulse 84, temperature 98.6 F (37 C), temperature source Oral, resp. rate 16, SpO2 97%, unknown if currently breastfeeding. Exam Physical Exam  AO x 3, NAD Gravid, soft Fetal heart rate 140,  reactive, category 1 tracing Prenatal labs: ABO, Rh: --/--/A POS (11/13 2055) Antibody: NEG (11/13 2055) Rubella:  Immune RPR:   Nonreactive HBsAg:   Negative HIV:   Reactive GBS:   Negative Results for orders placed or performed during the hospital encounter of 03/04/24 (from the past 24 hours)  Type and screen [facility]     Status: None   Collection Time: 03/04/24  8:55 PM  Result Value Ref Range   ABO/RH(D) A POS    Antibody Screen NEG    Sample Expiration      03/07/2024,2359 Performed at [facility], [address], [place], [place] [address]   CBC     Status: Abnormal   Collection Time: 03/04/24 10:03 PM  Result Value Ref Range   WBC 8.6 4.0 - 10.5 K/uL   RBC 3.95 3.87 - 5.11 MIL/uL   Hemoglobin 11.4 (L) 12.0 - 15.0 g/dL   HCT 65.8 (L) 63.9 - 53.9 %   MCV 86.3 80.0 - 100.0 fL   MCH 28.9 26.0 - 34.0 pg   MCHC 33.4 30.0 - 36.0 g/dL   RDW 88.4 88.4 - 84.4 %   Platelets 239 150 - 400 K/uL   nRBC 0.0 0.0 - 0.2 %     Assessment/Plan: 1) admit 2) epidural on  request 3) expectant management   Deborah Fischer 03/05/2024, 1:52 AM

## 2024-03-05 NOTE — Anesthesia Postprocedure Evaluation (Signed)
 Anesthesia Post Note  Patient: Deborah Fischer  Procedure(s) Performed: AN AD HOC LABOR EPIDURAL     Patient location during evaluation: Mother Baby Anesthesia Type: Epidural Level of consciousness: awake and alert Pain management: pain level controlled Vital Signs Assessment: post-procedure vital signs reviewed and stable Respiratory status: spontaneous breathing, nonlabored ventilation and respiratory function stable Cardiovascular status: stable Postop Assessment: no headache, no backache and epidural receding Anesthetic complications: no   No notable events documented.  Last Vitals:  Vitals:   03/05/24 0415 03/05/24 0515  BP:  106/60  Pulse:  87  Resp:  16  Temp: 36.4 C   SpO2: 98%     Last Pain:  Vitals:   03/05/24 0515  TempSrc:   PainSc: 0-No pain   Pain Goal: Patients Stated Pain Goal: 0 (03/04/24 1908)              Epidural/Spinal Function Cutaneous sensation: Able to Wiggle Toes (03/05/24 0515), Patient able to flex knees: Yes (03/05/24 0515), Patient able to lift hips off bed: Yes (03/05/24 0515), Back pain beyond tenderness at insertion site: No (03/05/24 0515), Progressively worsening motor and/or sensory loss: No (03/05/24 0515), Bowel and/or bladder incontinence post epidural: No (03/05/24 0515)  Jermain Curt

## 2024-03-05 NOTE — Lactation Note (Signed)
 This note was copied from a baby's chart. Lactation Consultation Note  Patient Name: Deborah Fischer Unijb'd Date: 03/05/2024 Age:28 hours   Attempted to see mom but she was resting and lights out.  Maternal Data    Feeding    LATCH Score                    Lactation Tools Discussed/Used    Interventions    Discharge    Consult Status      Larenz Frasier G 03/05/2024, 9:04 PM

## 2024-03-06 LAB — CBC
HCT: 35.3 % — ABNORMAL LOW (ref 36.0–46.0)
Hemoglobin: 11.5 g/dL — ABNORMAL LOW (ref 12.0–15.0)
MCH: 28.9 pg (ref 26.0–34.0)
MCHC: 32.6 g/dL (ref 30.0–36.0)
MCV: 88.7 fL (ref 80.0–100.0)
Platelets: 213 K/uL (ref 150–400)
RBC: 3.98 MIL/uL (ref 3.87–5.11)
RDW: 11.7 % (ref 11.5–15.5)
WBC: 10.6 K/uL — ABNORMAL HIGH (ref 4.0–10.5)
nRBC: 0 % (ref 0.0–0.2)

## 2024-03-06 NOTE — Addendum Note (Signed)
 Addendum  created 03/06/24 0845 by Cherilyn Slough, CRNA   Clinical Note Signed

## 2024-03-06 NOTE — Progress Notes (Signed)
 CSW received consult for hx of Depression. CSW met with Deborah Fischer to offer support and complete assessment. When CSW entered the room, Deborah Fischer was observed laying in bed. Infant was asleep on his back in the bassinet. CSW introduced self and explained reason for visit. Deborah Fischer presented as pleasant, calm, welcomed CSW visit, and remained engaged during encounter.   CSW assessed Deborah Fischer for mood and inquired about mental health history. Deborah Fischer reports she is feeling good. Deborah Fischer acknowledged a history of depression, reporting a history of situational depression a long time ago. Per chart review, Deborah Fischer was hospitalized inpatient behavioral health in 2018 and 2019 and chart review notes possible history of Bipolar disorder, which Deborah Fischer denies. Deborah Fischer denies any recent mental health concerns and/or symptoms and denies a history of postpartum depression following the birth of her older son in 2022. CSW assessed for safety. Deborah Fischer denied current/ SI/HI/domestic violence. CSW provided education regarding the baby blues period vs. perinatal mood disorders, discussed treatment and gave resources for mental health follow up if concerns arise.  CSW recommends self-evaluation during the postpartum time period using the New Mom Checklist from Postpartum Progress and encouraged Deborah Fischer to contact a medical professional if symptoms are noted at any time.    Deborah Fischer identified her grandmother as her main support and reports she has all needed items to care for infant, including a car seat, crib, bassinet, clothing, diapers, and wipes. Deborah Fischer has chosen [facility] for infant's follow up care and has an appointment scheduled for Monday, 03/08/24 at 8am.   CSW provided review of Sudden Infant Death Syndrome (SIDS) precautions.    CSW identifies no further need for intervention and no barriers to discharge at this time.  Signed,  Sharyne LOIS Roulette, MSW, LCSWA, LCASA 03/06/2024 12:07 PM

## 2024-03-06 NOTE — Lactation Note (Addendum)
 This note was copied from a baby's chart. Lactation Consultation Note  Patient Name: Deborah Fischer Date: 03/06/2024 Age:28 hours Reason for consult: Initial assessment;Term  P2. Mom didn't really BF her 1st child. Mom is trying to BF this baby. Mom had been crying before LC went to rm. Mom is by herself and said baby is cluster feeding and she is so tired. Mom said she broke down and gave him a bottle of formula. Newborn feeding habits, behavior, STS, I&O, milk storage, positioning reviewed. Mom encouraged to feed baby 8-12 times/24 hours and with feeding cues.  Mom thinks the baby is BF well but just a lot. Mom stated she can't really just only get a dot of colostrum. Mom demonstrated then [facility] demonstrated. RN brought mom coconut oil. Encouraged mom to apply it. Encouraged mom to call for assistance as needed. Maternal Data Has patient been taught Hand Expression?: Yes Does the patient have breastfeeding experience prior to this delivery?: No  Feeding Mother's Current Feeding Choice: Breast Milk and Formula  LATCH Score       Type of Nipple: Everted at rest and after stimulation  Comfort (Breast/Nipple): Filling, red/small blisters or bruises, mild/mod discomfort (sore from cluster feeding)         Lactation Tools Discussed/Used Tools: Pump;Coconut oil Breast pump type: Manual Pump Education: Setup, frequency, and cleaning;Milk Storage Reason for Pumping: mom doesn't have a pump  Interventions Interventions: Breast feeding basics reviewed;Hand express;Breast compression;Coconut oil;Hand pump;Education;LC Services brochure  Discharge Discharge Education: Outpatient recommendation Pump: Manual  Consult Status Consult Status: Follow-up Date: 03/06/24 Follow-up type: In-patient    Herny Scurlock G 03/06/2024, 1:50 AM

## 2024-03-06 NOTE — Progress Notes (Signed)
 Post Partum Day 1 Subjective: Patient is doing well. Mild abdominal cramping when breast feed. Lochia is minimal. Ambulating without lightheaded/dizziness. Voiding without difficulty. Breastfeeding.   Reviewed hx of bipolar diagnosis and drug use. Notes bipolar diagnosis was likely made when had a lot of stressful things going on. She is not on medications now and notes mood is stable. She has past history of cocaine use, but received outpatient treatment in 2019 and has not used since.   Patient notes FOB is out of town for work, but has been able to [employer] with him. Has support that includes her mother. Notes younger soon is excited to help her out.   She notes baby cluster fed last night, but has now gone 2 hr between feeds. Has questions about whether he is making enough wet diapers.   Objective: Blood pressure 111/77, pulse 65, temperature 98.5 F (36.9 C), temperature source Oral, resp. rate 16, SpO2 100%, unknown if currently breastfeeding.  Physical Exam:  General: alert and no distress Respiratory: no increased work of breathing  Lochia: appropriate Uterine Fundus: firm DVT Evaluation: No evidence of DVT seen on physical exam.  Recent Labs    03/04/24 2203 03/06/24 0448  HGB 11.4* 11.5*  HCT 34.1* 35.3*    Assessment/Plan: Doing well post-partum. 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  Reviewed that circumcision is an elective surgical procedure and not considered medically necessary.  Reviewed the risks of the procedure including the risk of infection, bleeding, damage to surrounding structures, including scrotum, shaft, urethra and head of penis, and an undesired cosmetic effect requiring additional procedures for  revision.  Breastfeeding - s/p lactation consult  Admit Hgb 11.4 and 11.5 on PPD1   LOS: 2 days   Charmaine CHRISTELLA Oz, MD 03/06/2024, 7:53 AM

## 2024-03-06 NOTE — Anesthesia Postprocedure Evaluation (Signed)
 Anesthesia Post Note  Patient: Deborah Fischer  Procedure(s) Performed: AN AD HOC LABOR EPIDURAL     Patient location during evaluation: Mother Baby Anesthesia Type: Epidural Level of consciousness: awake and alert Pain management: pain level controlled Vital Signs Assessment: post-procedure vital signs reviewed and stable Respiratory status: spontaneous breathing, nonlabored ventilation and respiratory function stable Cardiovascular status: stable Postop Assessment: no headache, no backache and epidural receding Anesthetic complications: no   No notable events documented.  Last Vitals:  Vitals:   03/05/24 2145 03/06/24 0422  BP: 111/78 111/77  Pulse: 79 65  Resp: 18 16  Temp: 36.6 C 36.9 C  SpO2: 100% 100%    Last Pain:  Vitals:   03/06/24 0422  TempSrc: Oral  PainSc:    Pain Goal: Patients Stated Pain Goal: 0 (03/04/24 1908)                 CHERILYN SLOUGH

## 2024-03-06 NOTE — Progress Notes (Signed)
 Patient assessment completed by Caprice Buoy RN.

## 2024-03-06 NOTE — Lactation Note (Signed)
 This note was copied from a baby's chart. Lactation Consultation Note  Patient Name: Deborah Fischer Unijb'd Date: 03/06/2024 Age:28 hours Reason for consult: Follow-up assessment;1st time breastfeeding;Term;Infant weight loss (infant weight loss -5.69%).  MOB informed LC infant is latching well at the breast today and his feedings are ranging from 17-20 minutes in length. LC did not observe latch due infant being in [address] for his circumcision. Per MOB, infant briefly latch prior to going. MOB knows infant will have rest period after the procedure, she will latch him at breast when he returns. MOB given hand pump by previous LC, LC reviewed how to use it and MOB was expressing colostrum which she will offer to infant after he breastfeeds at the next feeding. MOB aware day 2 of lfe that infant is cluster feeding. MOB will continue to breastfeed infant by cues, 8-12 times within 24 hours, skin to skin and post-pump with hand pump, supplement infant with her EBM MOB will use spoon or curve tip syringe as method of supplement. MOB offer infant formula one time since birth. MOB knows to call [facility] services if she has questions, concerns or need further assistance with latching infant at the breast.   Maternal Data    Feeding Mother's Current Feeding Choice: Breast Milk and Formula  LATCH Score                    Lactation Tools Discussed/Used Tools: Flanges;Pump Flange Size: 24 Pump Education: Setup, frequency, and cleaning;Milk Storage Reason for Pumping: MOB will offer EBM after latching infant at the breast declined using DEBP Pumping frequency: post pump after few feedings  Interventions Interventions: Skin to skin;Expressed milk;Hand pump;Education  Discharge    Consult Status Consult Status: Follow-up Date: 03/07/24 Follow-up type: In-patient    Grayce LULLA Batter 03/06/2024, 10:00 AM

## 2024-03-06 NOTE — Addendum Note (Signed)
 Addendum  created 03/06/24 0842 by Cherilyn Slough, CRNA   Clinical Note Signed

## 2024-03-07 MED ORDER — WITCH HAZEL-GLYCERIN EX PADS: 1.0000 | MEDICATED_PAD | CUTANEOUS | Status: AC | PRN

## 2024-03-07 MED ORDER — BENZOCAINE-MENTHOL 20-0.5 % EX AERO: 1.0000 | INHALATION_SPRAY | CUTANEOUS | Status: AC | PRN

## 2024-03-07 MED ORDER — IBUPROFEN 600 MG PO TABS: 600.0000 mg | ORAL_TABLET | Freq: Four times a day (QID) | ORAL | Status: AC

## 2024-03-07 MED ORDER — ACETAMINOPHEN 325 MG PO TABS: 650.0000 mg | ORAL_TABLET | Freq: Four times a day (QID) | ORAL | Status: AC | PRN

## 2024-03-07 NOTE — Discharge Summary (Signed)
 Postpartum Discharge Summary   Patient Name: Deborah Fischer DOB: 06/01/1995 MRN: [id number]  Date of admission: 03/04/2024 Delivery date:03/05/2024 Delivering provider: OKEY LEADER Date of discharge: 03/07/2024  Admitting diagnosis: Normal labor [O80, Z37.9] Encounter for elective induction of labor [Z34.90] Spontaneous vaginal delivery [O80] Intrauterine pregnancy: [redacted]w[redacted]d     Secondary diagnosis:  Principal Problem:   Normal labor Active Problems:   Encounter for elective induction of labor   Spontaneous vaginal delivery  Additional problems: HSV-2, remote hx drug use    Discharge diagnosis: Term Pregnancy Delivered                                              Post partum procedures: None Augmentation: AROM Complications: None  Hospital course: Onset of Labor With Vaginal Delivery      28 y.o. yo [id number] at [redacted]w[redacted]d was admitted in Active Labor on 03/04/2024. Labor course was uncomplicated.    Membrane Rupture Time/Date: 1:33 AM,03/05/2024  Delivery Method:Vaginal, Spontaneous Operative Delivery:N/A Episiotomy: None Lacerations:  None  Patient had an uncomplicated postpartum course. She is breastfeeding. Female fetus had circumcision on PPD1. Hemoglobin 11.4 on admit and stable on PPD1.  She is ambulating, tolerating a regular diet, passing flatus, and urinating well. Patient is discharged home in stable condition on 03/07/24.  Newborn Data: Birth date:03/05/2024 Birth time:5:03 AM Gender:Female Living status:Living Apgars:9 ,9  Weight:2900 g  Magnesium  Sulfate received: No BMZ received: No Rhophylac:N/A MMR:N/A T-DaP:declined  Flu: declined RSV Vaccine received: declined Transfusion:No Immunizations administered: There is no immunization history for the selected administration types on file for this patient.  Physical exam  Vitals:   03/06/24 0422 03/06/24 1353 03/06/24 2100 03/07/24 0500  BP: 111/77 110/77 106/70 108/76  Pulse: 65 62 75 75  Resp: 16 16 17  18   Temp: 98.5 F (36.9 C) 98.7 F (37.1 C) 98.2 F (36.8 C)   TempSrc: Oral Oral Oral Oral  SpO2: 100% 100% 100% 99%   General: alert, cooperative, and no distress Respiratory: no increased work of breathing Lochia: appropriate Uterine Fundus: firm DVT Evaluation: No evidence of DVT seen on physical exam. Psych: mood stable  Labs: Lab Results  Component Value Date   WBC 10.6 (H) 03/06/2024   HGB 11.5 (L) 03/06/2024   HCT 35.3 (L) 03/06/2024   MCV 88.7 03/06/2024   PLT 213 03/06/2024      Latest Ref Rng & Units 09/03/2023    9:05 AM  CMP  Glucose 70 - 99 mg/dL 89   BUN 6 - 20 mg/dL <5   Creatinine 9.55 - 1.00 mg/dL 9.38   Sodium 864 - 854 mmol/L 134   Potassium 3.5 - 5.1 mmol/L 3.4   Chloride 98 - 111 mmol/L 101   CO2 22 - 32 mmol/L 21   Calcium 8.9 - 10.3 mg/dL 9.4   Total Protein 6.5 - 8.1 g/dL 7.4   Total Bilirubin 0.0 - 1.2 mg/dL 0.8   Alkaline Phos 38 - 126 U/L 29   AST 15 - 41 U/L 29   ALT 0 - 44 U/L 14    Edinburgh Score:    03/05/2024    9:44 PM  Edinburgh Postnatal Depression Scale Screening Tool  I have been able to laugh and see the funny side of things. 0  I have looked forward with enjoyment to things. 0  I have blamed myself unnecessarily when  things went wrong. 0  I have been anxious or worried for no good reason. 0  I have felt scared or panicky for no good reason. 0  Things have been getting on top of me. 0  I have been so unhappy that I have had difficulty sleeping. 0  I have felt sad or miserable. 0  I have been so unhappy that I have been crying. 0  The thought of harming myself has occurred to me. 0  Edinburgh Postnatal Depression Scale Total 0      After visit meds:  Allergies as of 03/07/2024       Reactions   Banana Anaphylaxis, Swelling   Penicillins Rash   Has patient had a PCN reaction causing immediate rash, facial/tongue/throat swelling, SOB or lightheadedness with hypotension: NO Has patient had a PCN reaction causing  severe rash involving mucus membranes or skin necrosis: NO Has patient had a PCN reaction that required hospitalization: NO Has patient had a PCN reaction occurring within the last 10 years: Unknown If all of the above answers are NO, then may proceed with Cephalosporin use.        Medication List     STOP taking these medications    valACYclovir 500 MG tablet Commonly known as: VALTREX       TAKE these medications    acetaminophen  325 MG tablet Commonly known as: Tylenol  Take 2 tablets (650 mg total) by mouth every 6 (six) hours as needed (for pain scale < 4).   benzocaine-Menthol 20-0.5 % Aero Commonly known as: DERMOPLAST Apply 1 Application topically as needed for irritation (perineal discomfort).   Blood Pressure Kit Devi 1 kit by Does not apply route once a week.   ibuprofen  600 MG tablet Commonly known as: ADVIL  Take 1 tablet (600 mg total) by mouth every 6 (six) hours.   ondansetron  8 MG disintegrating tablet Commonly known as: ZOFRAN -ODT Take 1 tablet (8 mg total) by mouth every 8 (eight) hours as needed for nausea or vomiting.   PRENATAL VITAMIN/MIN +DHA PO Take by mouth.   witch hazel-glycerin pad Commonly known as: TUCKS Apply 1 Application topically as needed for hemorrhoids.         Discharge home in stable condition Infant Feeding: Breast Infant Disposition:home with mother Discharge instruction: per After Visit Summary and Postpartum booklet. Activity: Advance as tolerated. Pelvic rest for 6 weeks.  Diet: routine diet Anticipated Birth Control: Unsure Postpartum Appointment:6 weeks Additional Postpartum F/U: None - discussed w/ patient if develops signs/sx post-partum blue or depression to give the office a call and schedule appt Future Appointments: PP visit in 6 weeks, office will call with appointment date and time   03/07/2024 Charmaine CHRISTELLA Oz, MD

## 2024-03-07 NOTE — Progress Notes (Signed)
 Post Partum Day 2  Subjective: Patient is doing well. Denies pain.  Lochia is minimal. Ambulating without lightheaded/dizziness. Voiding without difficulty. Breastfeeding.    Objective: Blood pressure 108/76, pulse 75, temperature 98.2 F (36.8 C), temperature source Oral, resp. rate 18, SpO2 99%, unknown if currently breastfeeding.  Physical Exam:  General: alert and no distress Respiratory: no increased work of breathing  Lochia: appropriate Uterine Fundus: firm DVT Evaluation: No evidence of DVT seen on physical exam.  Recent Labs    03/04/24 2203 03/06/24 0448  HGB 11.4* 11.5*  HCT 34.1* 35.3*    Assessment/Plan: Doing well post-partum. 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

## 2024-03-07 NOTE — Care Plan (Signed)
 Patient received discharge education for postpartum care and preclampsia signs to watch for as long as post partum depression signs to look for. Pt aware and questions were answered. She states she is in a much better place in life and has 18 weeks off postpartum leave from her job. Encouraged to ask her OB for community resources for help if she feels she needs help during this time. Patient is very pleasant and bonding well with her son.

## 2024-03-17 ENCOUNTER — Telehealth (HOSPITAL_COMMUNITY): Payer: Self-pay | Admitting: *Deleted

## 2024-03-17 NOTE — Telephone Encounter (Signed)
 03/17/2024  Name: Deborah Fischer MRN: [id number] DOB: 24-Jul-1995  Reason for Call:  Transition of Care Hospital Discharge Call  Contact Status: Patient Contact Status: Message  Language assistant needed:          Follow-Up Questions:    Van Postnatal Depression Scale:  In the Past 7 Days:    PHQ2-9 Depression Scale:     Discharge Follow-up:    Post-discharge interventions: NA  Mliss Sieve, RN 03/17/2024 14:48

## 2024-03-24 ENCOUNTER — Encounter (HOSPITAL_COMMUNITY)
